# Patient Record
Sex: Female | Born: 1970
Health system: Southern US, Community
[De-identification: ages and names within clinical notes are randomized; demographics above are authoritative.]

## PROBLEM LIST (undated history)

## (undated) DIAGNOSIS — T8859XA Other complications of anesthesia, initial encounter: Secondary | ICD-10-CM

## (undated) DIAGNOSIS — F329 Major depressive disorder, single episode, unspecified: Secondary | ICD-10-CM

## (undated) DIAGNOSIS — F32A Depression, unspecified: Secondary | ICD-10-CM

## (undated) DIAGNOSIS — T4145XA Adverse effect of unspecified anesthetic, initial encounter: Secondary | ICD-10-CM

## (undated) DIAGNOSIS — Z9889 Other specified postprocedural states: Secondary | ICD-10-CM

## (undated) DIAGNOSIS — J45909 Unspecified asthma, uncomplicated: Secondary | ICD-10-CM

## (undated) DIAGNOSIS — I10 Essential (primary) hypertension: Secondary | ICD-10-CM

## (undated) DIAGNOSIS — H548 Legal blindness, as defined in USA: Secondary | ICD-10-CM

## (undated) DIAGNOSIS — R51 Headache: Secondary | ICD-10-CM

## (undated) DIAGNOSIS — R519 Headache, unspecified: Secondary | ICD-10-CM

## (undated) DIAGNOSIS — F514 Sleep terrors [night terrors]: Secondary | ICD-10-CM

## (undated) DIAGNOSIS — R112 Nausea with vomiting, unspecified: Secondary | ICD-10-CM

## (undated) HISTORY — DX: Depression, unspecified: F32.A

## (undated) HISTORY — PX: ABDOMINAL HYSTERECTOMY: SHX81

## (undated) HISTORY — DX: Major depressive disorder, single episode, unspecified: F32.9

## (undated) HISTORY — PX: ENDOMETRIAL ABLATION: SHX621

---

## 1997-08-29 ENCOUNTER — Encounter: Admission: RE | Admit: 1997-08-29 | Discharge: 1997-08-29 | Payer: Self-pay | Admitting: Sports Medicine

## 1997-09-20 ENCOUNTER — Encounter: Admission: RE | Admit: 1997-09-20 | Discharge: 1997-09-20 | Payer: Self-pay | Admitting: Family Medicine

## 1997-10-18 ENCOUNTER — Ambulatory Visit (HOSPITAL_COMMUNITY): Admission: RE | Admit: 1997-10-18 | Discharge: 1997-10-18 | Payer: Self-pay

## 1997-10-24 ENCOUNTER — Encounter: Admission: RE | Admit: 1997-10-24 | Discharge: 1997-10-24 | Payer: Self-pay | Admitting: Sports Medicine

## 1997-11-29 ENCOUNTER — Encounter: Admission: RE | Admit: 1997-11-29 | Discharge: 1997-11-29 | Payer: Self-pay | Admitting: Family Medicine

## 1997-12-29 ENCOUNTER — Encounter: Admission: RE | Admit: 1997-12-29 | Discharge: 1997-12-29 | Payer: Self-pay | Admitting: Family Medicine

## 1998-01-10 ENCOUNTER — Ambulatory Visit (HOSPITAL_COMMUNITY): Admission: RE | Admit: 1998-01-10 | Discharge: 1998-01-10 | Payer: Self-pay

## 1998-01-22 ENCOUNTER — Encounter: Admission: RE | Admit: 1998-01-22 | Discharge: 1998-01-22 | Payer: Self-pay | Admitting: Family Medicine

## 1998-01-31 ENCOUNTER — Encounter: Admission: RE | Admit: 1998-01-31 | Discharge: 1998-01-31 | Payer: Self-pay | Admitting: Family Medicine

## 1998-02-03 ENCOUNTER — Inpatient Hospital Stay (HOSPITAL_COMMUNITY): Admission: AD | Admit: 1998-02-03 | Discharge: 1998-02-03 | Payer: Self-pay | Admitting: *Deleted

## 1998-02-04 ENCOUNTER — Inpatient Hospital Stay (HOSPITAL_COMMUNITY): Admission: AD | Admit: 1998-02-04 | Discharge: 1998-02-04 | Payer: Self-pay | Admitting: Obstetrics & Gynecology

## 1998-02-06 ENCOUNTER — Inpatient Hospital Stay (HOSPITAL_COMMUNITY): Admission: AD | Admit: 1998-02-06 | Discharge: 1998-02-06 | Payer: Self-pay | Admitting: *Deleted

## 1998-02-07 ENCOUNTER — Encounter: Admission: RE | Admit: 1998-02-07 | Discharge: 1998-02-07 | Payer: Self-pay | Admitting: Family Medicine

## 1998-02-21 ENCOUNTER — Encounter: Admission: RE | Admit: 1998-02-21 | Discharge: 1998-02-21 | Payer: Self-pay | Admitting: Family Medicine

## 1998-02-27 ENCOUNTER — Ambulatory Visit (HOSPITAL_COMMUNITY): Admission: RE | Admit: 1998-02-27 | Discharge: 1998-02-27 | Payer: Self-pay | Admitting: *Deleted

## 1998-03-13 ENCOUNTER — Encounter: Admission: RE | Admit: 1998-03-13 | Discharge: 1998-03-13 | Payer: Self-pay | Admitting: Sports Medicine

## 1998-03-27 ENCOUNTER — Encounter: Admission: RE | Admit: 1998-03-27 | Discharge: 1998-03-27 | Payer: Self-pay | Admitting: Family Medicine

## 1998-04-05 ENCOUNTER — Encounter: Admission: RE | Admit: 1998-04-05 | Discharge: 1998-04-05 | Payer: Self-pay | Admitting: Family Medicine

## 1998-04-10 ENCOUNTER — Ambulatory Visit (HOSPITAL_COMMUNITY): Admission: RE | Admit: 1998-04-10 | Discharge: 1998-04-10 | Payer: Self-pay | Admitting: *Deleted

## 1998-04-12 ENCOUNTER — Encounter: Admission: RE | Admit: 1998-04-12 | Discharge: 1998-04-12 | Payer: Self-pay | Admitting: Family Medicine

## 1998-04-14 ENCOUNTER — Inpatient Hospital Stay (HOSPITAL_COMMUNITY): Admission: AD | Admit: 1998-04-14 | Discharge: 1998-04-16 | Payer: Self-pay | Admitting: *Deleted

## 1998-04-17 ENCOUNTER — Encounter: Admission: RE | Admit: 1998-04-17 | Discharge: 1998-04-17 | Payer: Self-pay | Admitting: Sports Medicine

## 1998-05-23 ENCOUNTER — Encounter: Admission: RE | Admit: 1998-05-23 | Discharge: 1998-05-23 | Payer: Self-pay | Admitting: Family Medicine

## 1998-06-05 ENCOUNTER — Other Ambulatory Visit: Admission: RE | Admit: 1998-06-05 | Discharge: 1998-06-05 | Payer: Self-pay

## 1998-06-05 ENCOUNTER — Encounter: Admission: RE | Admit: 1998-06-05 | Discharge: 1998-06-05 | Payer: Self-pay | Admitting: Sports Medicine

## 1999-03-13 ENCOUNTER — Encounter: Admission: RE | Admit: 1999-03-13 | Discharge: 1999-03-13 | Payer: Self-pay | Admitting: Family Medicine

## 2001-04-15 ENCOUNTER — Encounter: Admission: RE | Admit: 2001-04-15 | Discharge: 2001-04-15 | Payer: Self-pay | Admitting: Sports Medicine

## 2001-04-27 ENCOUNTER — Encounter: Admission: RE | Admit: 2001-04-27 | Discharge: 2001-04-27 | Payer: Self-pay | Admitting: Family Medicine

## 2001-04-27 ENCOUNTER — Encounter: Admission: RE | Admit: 2001-04-27 | Discharge: 2001-04-27 | Payer: Self-pay | Admitting: *Deleted

## 2001-04-27 ENCOUNTER — Encounter: Payer: Self-pay | Admitting: *Deleted

## 2001-06-30 ENCOUNTER — Encounter: Admission: RE | Admit: 2001-06-30 | Discharge: 2001-06-30 | Payer: Self-pay | Admitting: Family Medicine

## 2001-10-12 ENCOUNTER — Encounter: Admission: RE | Admit: 2001-10-12 | Discharge: 2001-10-12 | Payer: Self-pay | Admitting: Family Medicine

## 2004-12-23 ENCOUNTER — Ambulatory Visit (HOSPITAL_COMMUNITY): Admission: RE | Admit: 2004-12-23 | Discharge: 2004-12-23 | Payer: Self-pay | Admitting: Family Medicine

## 2007-03-24 ENCOUNTER — Encounter: Payer: Self-pay | Admitting: Obstetrics & Gynecology

## 2007-03-24 ENCOUNTER — Ambulatory Visit (HOSPITAL_COMMUNITY): Admission: RE | Admit: 2007-03-24 | Discharge: 2007-03-24 | Payer: Self-pay | Admitting: Obstetrics & Gynecology

## 2008-10-04 ENCOUNTER — Ambulatory Visit (HOSPITAL_COMMUNITY): Admission: RE | Admit: 2008-10-04 | Discharge: 2008-10-04 | Payer: Self-pay | Admitting: Family Medicine

## 2008-11-22 ENCOUNTER — Ambulatory Visit (HOSPITAL_COMMUNITY): Admission: RE | Admit: 2008-11-22 | Discharge: 2008-11-22 | Payer: Self-pay | Admitting: Family Medicine

## 2010-03-04 ENCOUNTER — Ambulatory Visit (HOSPITAL_COMMUNITY): Admission: RE | Admit: 2010-03-04 | Discharge: 2010-03-04 | Payer: Self-pay | Admitting: Internal Medicine

## 2010-03-24 ENCOUNTER — Emergency Department (HOSPITAL_COMMUNITY): Admission: EM | Admit: 2010-03-24 | Discharge: 2010-03-24 | Payer: Self-pay | Admitting: Emergency Medicine

## 2010-06-16 ENCOUNTER — Encounter: Payer: Self-pay | Admitting: Family Medicine

## 2010-08-07 LAB — URINALYSIS, ROUTINE W REFLEX MICROSCOPIC
Bilirubin Urine: NEGATIVE
Glucose, UA: NEGATIVE mg/dL
Specific Gravity, Urine: 1.025 (ref 1.005–1.030)
pH: 5.5 (ref 5.0–8.0)

## 2010-08-07 LAB — URINE MICROSCOPIC-ADD ON

## 2010-08-07 LAB — COMPREHENSIVE METABOLIC PANEL
AST: 32 U/L (ref 0–37)
Albumin: 3.4 g/dL — ABNORMAL LOW (ref 3.5–5.2)
Chloride: 103 mEq/L (ref 96–112)
Creatinine, Ser: 0.65 mg/dL (ref 0.4–1.2)
Sodium: 137 mEq/L (ref 135–145)
Total Bilirubin: 0.8 mg/dL (ref 0.3–1.2)
Total Protein: 6.4 g/dL (ref 6.0–8.3)

## 2010-08-07 LAB — DIFFERENTIAL
Basophils Absolute: 0 10*3/uL (ref 0.0–0.1)
Eosinophils Relative: 1 % (ref 0–5)
Lymphocytes Relative: 24 % (ref 12–46)
Lymphs Abs: 1 10*3/uL (ref 0.7–4.0)
Monocytes Absolute: 0.2 10*3/uL (ref 0.1–1.0)
Monocytes Relative: 5 % (ref 3–12)
Neutro Abs: 2.8 10*3/uL (ref 1.7–7.7)

## 2010-08-07 LAB — CBC
HCT: 40.7 % (ref 36.0–46.0)
Hemoglobin: 14.2 g/dL (ref 12.0–15.0)
MCHC: 34.8 g/dL (ref 30.0–36.0)
MCV: 87.1 fL (ref 78.0–100.0)

## 2010-10-08 NOTE — H&P (Signed)
NAME:  Marie Webb, Marie Webb NO.:  [id number]   MEDICAL RECORD NO.:  [id number]          PATIENT TYPE:  AMB   LOCATION:  DAY                           FACILITY:  [facility]   PHYSICIAN:  Lazaro Arms, M.D.   DATE OF BIRTH:  06-24-1970   DATE OF ADMISSION:  DATE OF DISCHARGE:  LH                              HISTORY & PHYSICAL   HISTORY:  Marie Webb is a 40 year old female, gravida 5, para 4, abortus 1,  status post tubal ligation, who has increasingly worsening periods.  She  bleeds for 5-7 days, has clots for about 2-3 of those days, changes pads  every 45 minutes.  Her uterus is slightly enlarged at eight weeks' size.  Ultrasound reveals no submucosal myomas and she desires endometrial  ablation for therapy as opposed to hysterectomy.  She understands that  in the future she may need something more definitive, depending on what  the fibroids do.   PAST MEDICAL HISTORY:  Negative.   PAST SURGICAL HISTORY:  Tubal ligation.   PAST OB HISTORY:  All vaginal deliveries and one pregnancy lost.   She is on iron tablets.  Her hemoglobin is 10.9.   PHYSICAL EXAMINATION:  HEENT:  Unremarkable.  NECK:  Thyroid is normal.  LUNGS:  Clear.  HEART:  Regular rate and rhythm without regurge or gallop.  BREASTS:  Without mass, discharge, skin changes.  ABDOMEN:  Benign.  No hepatosplenomegaly or masses.  GENITALIA:  She has normal external genitalia.  Uterus is eight weeks'  size.  Adnexa is negative.   IMPRESSION:  1. Menometrorrhagia.  2. Dysmenorrhea.  3. Anemia.   PLAN:  Patient admitted for hysteroscopy/D&C and endometrial ablation.  She understands the risks, benefits, indications, alternatives and will  proceed.     Lazaro Arms, M.D.  Electronically Signed    LHE/MEDQ  D:  03/23/2007  T:  03/23/2007  Job:  [id number]

## 2010-10-08 NOTE — Op Note (Signed)
Marie Webb, Marie Webb             ACCOUNT NO.:  [id number]   MEDICAL RECORD NO.:  [id number]          PATIENT TYPE:  AMB   LOCATION:  DAY                           FACILITY:  [facility]   PHYSICIAN:  Lazaro Arms, M.D.   DATE OF BIRTH:  12-06-1970   DATE OF PROCEDURE:  03/24/2007  DATE OF DISCHARGE:                               OPERATIVE REPORT   PREOPERATIVE DIAGNOSES:  1. Menometrorrhagia.  2. Dysmenorrhea.   POSTOPERATIVE DIAGNOSES:  1. Menometrorrhagia.  2. Dysmenorrhea.   PROCEDURE:  Hysteroscopy D&C, endometrial ablation.   SURGEON:  Lazaro Arms, M.D.   ANESTHESIA:  General endotracheal.   FINDINGS:  The patient had a normal endometrial cavity, no polyps, no  fibroids, no lesions.   DESCRIPTION OF PROCEDURE:  The patient was taken to the operating room,  placed in the supine position, underwent general endotracheal  anesthesia, prepped and draped in the usual sterile fashion. After being  placed in dorsal lithotomy position, the cervix was grasped, dilated  serially, diagnostic hysteroscopy was performed and it was normal. A  vigorous uterine curettage was performed with good uterine cry being  obtained in all areas. A ThermaChoice 3 endometrial ablation balloon was  then used. 50 mL of D-5-W was required to maintain a pressure of 192  mmHg throughout the procedure. It was heated to 87 degrees Celsius,  total therapy time was 15 minutes and 41 seconds. All fluids returned at  the end of the procedure. The patient tolerated the procedure well. She  experienced minimal blood loss. Taken to the recovery room in good  stable condition. All counts correct x3. She received Ancef  prophylactically.      Lazaro Arms, M.D.  Electronically Signed     LHE/MEDQ  D:  03/24/2007  T:  03/24/2007  Job:  [id number]

## 2011-03-05 LAB — COMPREHENSIVE METABOLIC PANEL
ALT: 13
AST: 10
Albumin: 3.9
CO2: 31
Calcium: 9.1
Chloride: 105
Creatinine, Ser: 0.61
GFR calc Af Amer: >60
GFR calc non Af Amer: >60
Sodium: 141
Total Bilirubin: 0.4

## 2011-03-05 LAB — URINALYSIS, ROUTINE W REFLEX MICROSCOPIC
Bilirubin Urine: NEGATIVE
Glucose, UA: NEGATIVE
Ketones, ur: NEGATIVE
Protein, ur: NEGATIVE
pH: 7

## 2011-03-05 LAB — DIFFERENTIAL
Eosinophils Absolute: 0.5
Eosinophils Relative: 7 — ABNORMAL HIGH
Lymphocytes Relative: 22
Lymphs Abs: 1.4
Monocytes Absolute: 0.4

## 2011-03-05 LAB — CBC
MCV: 84.4
Platelets: 229
RBC: 4.79
WBC: 6.4

## 2011-05-08 ENCOUNTER — Other Ambulatory Visit (HOSPITAL_COMMUNITY): Payer: Self-pay | Admitting: Physician Assistant

## 2011-05-09 ENCOUNTER — Ambulatory Visit (HOSPITAL_COMMUNITY)
Admission: RE | Admit: 2011-05-09 | Discharge: 2011-05-09 | Disposition: A | Discharge status: Home / Self Care | Payer: Self-pay | Source: Ambulatory Visit | Attending: Physician Assistant | Admitting: Physician Assistant

## 2011-05-09 DIAGNOSIS — R109 Unspecified abdominal pain: Secondary | ICD-10-CM | POA: Insufficient documentation

## 2011-05-09 DIAGNOSIS — Z9889 Other specified postprocedural states: Secondary | ICD-10-CM | POA: Insufficient documentation

## 2011-06-06 ENCOUNTER — Other Ambulatory Visit (HOSPITAL_COMMUNITY): Payer: Self-pay | Admitting: Physician Assistant

## 2011-06-06 DIAGNOSIS — Z1231 Encounter for screening mammogram for malignant neoplasm of breast: Secondary | ICD-10-CM

## 2011-06-12 ENCOUNTER — Other Ambulatory Visit: Payer: Self-pay | Admitting: Obstetrics and Gynecology

## 2011-06-12 ENCOUNTER — Ambulatory Visit (HOSPITAL_COMMUNITY)
Admission: RE | Admit: 2011-06-12 | Discharge: 2011-06-12 | Disposition: A | Discharge status: Home / Self Care | Payer: Self-pay | Source: Ambulatory Visit | Attending: Physician Assistant | Admitting: Physician Assistant

## 2011-06-12 DIAGNOSIS — Z1231 Encounter for screening mammogram for malignant neoplasm of breast: Secondary | ICD-10-CM

## 2013-06-17 ENCOUNTER — Encounter (HOSPITAL_COMMUNITY): Payer: Self-pay | Admitting: Emergency Medicine

## 2013-06-17 ENCOUNTER — Emergency Department (HOSPITAL_COMMUNITY): Payer: Self-pay

## 2013-06-17 ENCOUNTER — Observation Stay (HOSPITAL_COMMUNITY)
Admission: EM | Admit: 2013-06-17 | Discharge: 2013-06-18 | Disposition: A | Discharge status: Home / Self Care | Payer: Self-pay | Attending: Emergency Medicine | Admitting: Emergency Medicine

## 2013-06-17 DIAGNOSIS — R05 Cough: Secondary | ICD-10-CM | POA: Diagnosis present

## 2013-06-17 DIAGNOSIS — R0602 Shortness of breath: Principal | ICD-10-CM | POA: Insufficient documentation

## 2013-06-17 DIAGNOSIS — J45909 Unspecified asthma, uncomplicated: Secondary | ICD-10-CM | POA: Insufficient documentation

## 2013-06-17 DIAGNOSIS — J45901 Unspecified asthma with (acute) exacerbation: Secondary | ICD-10-CM | POA: Diagnosis present

## 2013-06-17 DIAGNOSIS — R0989 Other specified symptoms and signs involving the circulatory and respiratory systems: Secondary | ICD-10-CM | POA: Insufficient documentation

## 2013-06-17 DIAGNOSIS — R000 Tachycardia, unspecified: Secondary | ICD-10-CM | POA: Diagnosis present

## 2013-06-17 DIAGNOSIS — R059 Cough, unspecified: Secondary | ICD-10-CM | POA: Insufficient documentation

## 2013-06-17 HISTORY — DX: Unspecified asthma, uncomplicated: J45.909

## 2013-06-17 LAB — CBC WITH DIFFERENTIAL/PLATELET
Basophils Absolute: 0 10*3/uL (ref 0.0–0.1)
Basophils Relative: 0 % (ref 0–1)
Eosinophils Absolute: 0.6 10*3/uL (ref 0.0–0.7)
Eosinophils Relative: 4 % (ref 0–5)
HCT: 43.8 % (ref 36.0–46.0)
Hemoglobin: 15.4 g/dL — ABNORMAL HIGH (ref 12.0–15.0)
LYMPHS ABS: 1.9 10*3/uL (ref 0.7–4.0)
LYMPHS PCT: 12 % (ref 12–46)
MCH: 31.2 pg (ref 26.0–34.0)
MCHC: 35.2 g/dL (ref 30.0–36.0)
MCV: 88.7 fL (ref 78.0–100.0)
Monocytes Absolute: 0.7 10*3/uL (ref 0.1–1.0)
Monocytes Relative: 5 % (ref 3–12)
NEUTROS ABS: 12.2 10*3/uL — AB (ref 1.7–7.7)
NEUTROS PCT: 79 % — AB (ref 43–77)
PLATELETS: 237 10*3/uL (ref 150–400)
RBC: 4.94 MIL/uL (ref 3.87–5.11)
RDW: 12.7 % (ref 11.5–15.5)
WBC: 15.4 10*3/uL — AB (ref 4.0–10.5)

## 2013-06-17 LAB — COMPREHENSIVE METABOLIC PANEL
ALK PHOS: 72 U/L (ref 39–117)
ALT: 13 U/L (ref 0–35)
AST: 13 U/L (ref 0–37)
Albumin: 3.8 g/dL (ref 3.5–5.2)
BUN: 16 mg/dL (ref 6–23)
CO2: 24 meq/L (ref 19–32)
Calcium: 9.2 mg/dL (ref 8.4–10.5)
Chloride: 101 mEq/L (ref 96–112)
Creatinine, Ser: 0.55 mg/dL (ref 0.50–1.10)
GLUCOSE: 170 mg/dL — AB (ref 70–99)
POTASSIUM: 3.3 meq/L — AB (ref 3.7–5.3)
SODIUM: 139 meq/L (ref 137–147)
Total Bilirubin: 0.3 mg/dL (ref 0.3–1.2)
Total Protein: 7.4 g/dL (ref 6.0–8.3)

## 2013-06-17 MED ORDER — IPRATROPIUM BROMIDE 0.02 % IN SOLN
0.5000 mg | Freq: Once | RESPIRATORY_TRACT | Status: AC
  Administered 2013-06-17: 0.5 mg via RESPIRATORY_TRACT
  Filled 2013-06-17: qty 2.5

## 2013-06-17 MED ORDER — METHYLPREDNISOLONE SODIUM SUCC 125 MG IJ SOLR
125.0000 mg | Freq: Once | INTRAMUSCULAR | Status: AC
  Administered 2013-06-17: 125 mg via INTRAVENOUS
  Filled 2013-06-17: qty 2

## 2013-06-17 MED ORDER — ALBUTEROL SULFATE (2.5 MG/3ML) 0.083% IN NEBU
2.5000 mg | INHALATION_SOLUTION | Freq: Once | RESPIRATORY_TRACT | Status: AC
  Administered 2013-06-17: 2.5 mg via RESPIRATORY_TRACT
  Filled 2013-06-17: qty 3

## 2013-06-17 MED ORDER — ALBUTEROL SULFATE (2.5 MG/3ML) 0.083% IN NEBU
5.0000 mg | INHALATION_SOLUTION | Freq: Once | RESPIRATORY_TRACT | Status: AC
  Administered 2013-06-18: 5 mg via RESPIRATORY_TRACT
  Filled 2013-06-17: qty 6

## 2013-06-17 MED ORDER — IPRATROPIUM BROMIDE 0.02 % IN SOLN
0.5000 mg | Freq: Once | RESPIRATORY_TRACT | Status: AC
  Administered 2013-06-18: 0.5 mg via RESPIRATORY_TRACT
  Filled 2013-06-17: qty 2.5

## 2013-06-17 MED ORDER — ALBUTEROL SULFATE (2.5 MG/3ML) 0.083% IN NEBU
5.0000 mg | INHALATION_SOLUTION | Freq: Once | RESPIRATORY_TRACT | Status: AC
  Administered 2013-06-17: 5 mg via RESPIRATORY_TRACT
  Filled 2013-06-17: qty 6

## 2013-06-17 NOTE — ED Provider Notes (Addendum)
CSN: [id number]     Arrival date & time 06/17/13  2029 History  This chart was scribed for Benny LennertJoseph L Lashawnta Burgert, MD by Quintella ReichertMatthew Underwood, ED scribe.  This patient was seen in room [facility] and the patient's care was started at 9:25 PM.   Chief Complaint  Patient presents with  . Shortness of Breath    Patient is a 43 y.o. female presenting with shortness of breath. The history is provided by the patient. No language interpreter was used.  Shortness of Breath Severity:  Moderate Duration:  1 day Timing:  Constant Progression:  Worsening Chronicity:  New Context: URI   Ineffective treatments:  Inhaler Associated symptoms: cough and wheezing   Associated symptoms: no abdominal pain, no chest pain, no headaches and no rash     HPI Comments: Amanee Christel Mormon Lacivita is a 43 y.o. female who presents to the Emergency Department complaining of moderate wheezing that began today.  Pt states she has been sick for the past several days with productive cough, congestion and chills.  Today she became short of breath.  She attempted to treat SOB with her inhaler, without relief.  She states "my chest felt like it was going to close up."  She was given breathing treatment in the ED which she states has provided some relief.  Pt denies prior h/o similar symptoms and states her asthma is normally well-controlled.  She denies h/o admission for respiratory issues  She does not smoke.  [facility]   Past Medical History  Diagnosis Date  . Asthma     Past Surgical History  Procedure Laterality Date  . Endometrial ablation      History reviewed. No pertinent family history.   History  Substance Use Topics  . Smoking status: Never Smoker   . Smokeless tobacco: Not on file  . Alcohol Use: No    OB History   Grav Para Term Preterm Abortions TAB SAB Ect Mult Living                  Review of Systems  Constitutional: Positive for chills. Negative for appetite change  and fatigue.  HENT: Positive for congestion. Negative for ear discharge and sinus pressure.   Eyes: Negative for discharge.  Respiratory: Positive for cough, shortness of breath and wheezing.   Cardiovascular: Negative for chest pain.  Gastrointestinal: Negative for abdominal pain and diarrhea.  Genitourinary: Negative for frequency and hematuria.  Musculoskeletal: Negative for back pain.  Skin: Negative for rash.  Neurological: Negative for seizures and headaches.  Psychiatric/Behavioral: Negative for hallucinations.     Allergies  Review of patient's allergies indicates no known allergies.  Home Medications   Current Outpatient Rx  Name  Route  Sig  Dispense  Refill  . albuterol (PROVENTIL HFA;VENTOLIN HFA) 108 (90 BASE) MCG/ACT inhaler   Inhalation   Inhale 1-2 puffs into the lungs every 6 (six) hours as needed for wheezing or shortness of breath.         . DM-Doxylamine-Acetaminophen 15-6.25-325 MG/15ML LIQD   Oral   Take 15 mLs by mouth at bedtime as needed (Congestion).         Marland Kitchen. ibuprofen (ADVIL,MOTRIN) 200 MG tablet   Oral   Take 800 mg by mouth every 6 (six) hours as needed for moderate pain.          BP 154/106  Pulse 125  Temp(Src) 97.6 F (36.4 C) (Oral)  Resp 28  Ht 5\' 4"  (1.626 m)  Wt 145 lb (65.772 kg)  BMI 24.88 kg/m2  SpO2 95%  LMP 06/17/2013  Physical Exam  Nursing note and vitals reviewed. Constitutional: She is oriented to person, place, and time. She appears well-developed.  HENT:  Head: Normocephalic.  Eyes: Conjunctivae and EOM are normal. No scleral icterus.  Neck: Neck supple. No thyromegaly present.  Cardiovascular: Normal rate and regular rhythm.  Exam reveals no gallop and no friction rub.   No murmur heard. Pulmonary/Chest: No stridor. She has wheezes (moderate wheezing bilaterally). She has no rales. She exhibits no tenderness.  Abdominal: She exhibits no distension. There is no tenderness. There is no rebound.   Musculoskeletal: Normal range of motion. She exhibits no edema.  Lymphadenopathy:    She has no cervical adenopathy.  Neurological: She is oriented to person, place, and time. She exhibits normal muscle tone. Coordination normal.  Skin: No rash noted. No erythema.  Psychiatric: She has a normal mood and affect. Her behavior is normal.    ED Course  Procedures (including critical care time)  DIAGNOSTIC STUDIES: Oxygen Saturation is 95% on room air, adequate by my interpretation.    COORDINATION OF CARE: 9:28 PM-Discussed treatment plan which includes breathing treatment, CXR and labswith pt at bedside and pt agreed to plan.    Labs Review Labs Reviewed - No data to display  Imaging Review No results found.  EKG Interpretation   None       MDM  Admit for asthma The chart was scribed for me under my direct supervision.  I personally performed the history, physical, and medical decision making and all procedures in the evaluation of this patient.Benny Lennert, MD 06/18/13 1610  Benny Lennert, MD 06/18/13 [phone]

## 2013-06-17 NOTE — ED Notes (Signed)
RESPIRATORY PAGED 

## 2013-06-17 NOTE — ED Notes (Signed)
Sob all day, no relief with inhaler.  Cough,

## 2013-06-18 DIAGNOSIS — J45909 Unspecified asthma, uncomplicated: Secondary | ICD-10-CM | POA: Diagnosis present

## 2013-06-18 DIAGNOSIS — J45901 Unspecified asthma with (acute) exacerbation: Secondary | ICD-10-CM | POA: Diagnosis present

## 2013-06-18 DIAGNOSIS — R000 Tachycardia, unspecified: Secondary | ICD-10-CM

## 2013-06-18 DIAGNOSIS — R059 Cough, unspecified: Secondary | ICD-10-CM | POA: Diagnosis present

## 2013-06-18 DIAGNOSIS — R05 Cough: Secondary | ICD-10-CM | POA: Diagnosis present

## 2013-06-18 HISTORY — DX: Tachycardia, unspecified: R00.0

## 2013-06-18 MED ORDER — ALBUTEROL SULFATE HFA 108 (90 BASE) MCG/ACT IN AERS
2.0000 | INHALATION_SPRAY | Freq: Once | RESPIRATORY_TRACT | Status: AC
  Administered 2013-06-18: 2 via RESPIRATORY_TRACT
  Filled 2013-06-18: qty 6.7

## 2013-06-18 MED ORDER — ALBUTEROL SULFATE (2.5 MG/3ML) 0.083% IN NEBU: 2.5000 mg | INHALATION_SOLUTION | Freq: Four times a day (QID) | RESPIRATORY_TRACT | Status: DC | PRN

## 2013-06-18 MED ORDER — PREDNISONE 20 MG PO TABS: 40.0000 mg | ORAL_TABLET | Freq: Every day | ORAL | Status: DC

## 2013-06-18 MED ORDER — AZITHROMYCIN 250 MG PO TABS
500.0000 mg | ORAL_TABLET | Freq: Every day | ORAL | Status: DC
  Administered 2013-06-18: 500 mg via ORAL
  Filled 2013-06-18: qty 2

## 2013-06-18 MED ORDER — AZITHROMYCIN 250 MG PO TABS: 250.0000 mg | ORAL_TABLET | Freq: Every day | ORAL | Status: DC

## 2013-06-18 NOTE — H&P (Signed)
Chief Complaint:  Sob and wheezing  HPI: 43 yo female h/o asthma comes in tonight for progressive worsening sob and wheezing tonight despite her albuterol inhaler that she used several times tonight.  She has been coughing more the last several days.  No fevers.  No n/v/d.  No cp.  She does not have frequent exacerbations of her asthma.  She lives with her husband and 4 children.  No le edema or swelling.  She has received several albuterol nebs here along with iv solumedrol 125 mg and feels much better already.  Still has some wheezing, and is tachycardic.  She does not have health insurance and is concerned about the cost of being hospitalized and wishes to try outpt management along with the fact she has 4 children at home.  Review of Systems:  Positive and negative as per HPI otherwise all other systems are negative  Past Medical History: Past Medical History  Diagnosis Date  . Asthma    Past Surgical History  Procedure Laterality Date  . Endometrial ablation      Medications: Prior to Admission medications   Medication Sig Start Date End Date Taking? Authorizing Provider  albuterol (PROVENTIL HFA;VENTOLIN HFA) 108 (90 BASE) MCG/ACT inhaler Inhale 1-2 puffs into the lungs every 6 (six) hours as needed for wheezing or shortness of breath.   Yes Historical Provider, MD  DM-Doxylamine-Acetaminophen 15-6.25-325 MG/15ML LIQD Take 15 mLs by mouth at bedtime as needed (Congestion).   Yes Historical Provider, MD  ibuprofen (ADVIL,MOTRIN) 200 MG tablet Take 800 mg by mouth every 6 (six) hours as needed for moderate pain.   Yes Historical Provider, MD    Allergies:  No Known Allergies  Social History:  reports that she has never smoked. She does not have any smokeless tobacco history on file. She reports that she does not drink alcohol or use illicit drugs.  Family History: History reviewed. No pertinent family history.  Physical Exam: Filed Vitals:   06/17/13 2034 06/17/13 2106  06/17/13 2146 06/18/13 0003  BP: 154/106     Pulse: 125     Temp: 97.6 F (36.4 C)     TempSrc: Oral     Resp: 28     Height: 5\' 4"  (1.626 m)     Weight: 65.772 kg (145 lb)     SpO2: 91% 95% 97% 96%   General appearance: alert, cooperative and no distress Head: Normocephalic, without obvious abnormality, atraumatic Eyes: negative Nose: Nares normal. Septum midline. Mucosa normal. No drainage or sinus tenderness. Neck: no JVD and supple, symmetrical, trachea midline Lungs: wheezes bibasilar Heart: tachycardic rr no m/r/g Abdomen: soft, non-tender; bowel sounds normal; no masses,  no organomegaly Extremities: extremities normal, atraumatic, no cyanosis or edema Pulses: 2+ and symmetric Skin: Skin color, texture, turgor normal. No rashes or lesions Neurologic: Grossly normal    Labs on Admission:   Recent Labs  06/17/13 2147  NA 139  K 3.3*  CL 101  CO2 24  GLUCOSE 170*  BUN 16  CREATININE 0.55  CALCIUM 9.2    Recent Labs  06/17/13 2147  AST 13  ALT 13  ALKPHOS 72  BILITOT 0.3  PROT 7.4  ALBUMIN 3.8    Recent Labs  06/17/13 2147  WBC 15.4*  NEUTROABS 12.2*  HGB 15.4*  HCT 43.8  MCV 88.7  PLT 237    Radiological Exams on Admission: Dg Chest 2 View  06/17/2013   CLINICAL DATA:  Cough, congestion and chest pressure. History of asthma.  EXAM: CHEST  2 VIEW  COMPARISON:  None.  FINDINGS: The lungs are well-aerated and clear. There is no evidence of focal opacification, pleural effusion or pneumothorax.  The heart is normal in size; the mediastinal contour is within normal limits. No acute osseous abnormalities are seen.  IMPRESSION: No acute cardiopulmonary process seen.   Electronically Signed   By: Roanna RaiderJeffery  Chang M.D.   On: 06/17/2013 22:55    Assessment/Plan  43 yo female with acute asthma exacrbation  Principal Problem:   Acute asthma exacerbation-  Would give pt azithromycin which ive ordered.  She seems to have improved much with tx given in ED.   cxr neg for infiltrate or edema.  Would walk her around and assess whether her oxgyen sats drop significantly or not.  If it does not, i think pt is reliable enough along with her husband to try outpt management of this exacerbation.  If she chooses that route, would give her a steroid taper and a zpack along with her home inhaler of albuterol.  Tachycardia is iatrogenic from bronchodilators here.  She should continue to improve with the steroids, alb at home.  She has been instructed if she were to worsen again, to return to the ED for reassessment.  Her and her husband understand and agree.  Active Problems:   Asthma   Cough   Sinus tachycardia    Ronell Duffus A 06/18/2013, 12:36 AM

## 2013-06-18 NOTE — Discharge Instructions (Signed)
[facility] ° ° ° °Edward Hawkins MD. Specialty: Pulmonary Disease Contact information: [address]  °[address]  °[place] [address]  °[phone]  ° °Margaret Simpson, MD. Specialty: Family Medicine Contact information: [address]  °[place] [address]  °[phone]  ° °Scott Luking, MD. Specialty: Family Medicine Contact information: [address]  °[address]  °[place] [address]  °[phone]  ° °Tesfaye Fanta, MD Specialty: Internal Medicine Contact information: [address]  °[place] [address]  °[phone]  ° °Zach Hall, MD. Specialty: Internal Medicine Contact information: [address]  °[place] [address]  °[phone]  ° °Angus Mcinnis, MD. Specialty: Family Medicine Contact information: [address]  °[place] [address]  °[phone]  ° °Stephen Knowlton, MD. Specialty: Family Medicine Contact information: [address]  °[address]  °[place] [address]  °[phone]  ° °Roy Fagan, MD. Specialty: Internal Medicine Contact information: [address]  °[address]  ° [place] [address]  °[phone]  ° ° °

## 2013-06-18 NOTE — ED Provider Notes (Signed)
The patient has refused admission to the hospital citing financial reasons. Her heart rate has reduced down to approximately 120, she is insistent on being discharged, she will be given a Zithromax prescription at the request of the hospitalist who has seen the patient and agrees that she can be discharged safely at her request. She will also be given albuterol inhaler metered dose inhaler for home as well as nebulizer refills and prednisone.  Meds given in ED:  Medications  azithromycin (ZITHROMAX) tablet 500 mg (not administered)  albuterol (PROVENTIL HFA;VENTOLIN HFA) 108 (90 BASE) MCG/ACT inhaler 2 puff (not administered)  albuterol (PROVENTIL) (2.5 MG/3ML) 0.083% nebulizer solution 2.5 mg (2.5 mg Nebulization Given 06/17/13 2106)  ipratropium (ATROVENT) nebulizer solution 0.5 mg (0.5 mg Nebulization Given 06/17/13 2106)  albuterol (PROVENTIL) (2.5 MG/3ML) 0.083% nebulizer solution 5 mg (5 mg Nebulization Given 06/17/13 2145)  ipratropium (ATROVENT) nebulizer solution 0.5 mg (0.5 mg Nebulization Given 06/17/13 2145)  methylPREDNISolone sodium succinate (SOLU-MEDROL) 125 mg/2 mL injection 125 mg (125 mg Intravenous Given 06/17/13 2209)  ipratropium (ATROVENT) nebulizer solution 0.5 mg (0.5 mg Nebulization Given 06/18/13 0001)  albuterol (PROVENTIL) (2.5 MG/3ML) 0.083% nebulizer solution 5 mg (5 mg Nebulization Given 06/18/13 0001)    New Prescriptions   ALBUTEROL (PROVENTIL) (2.5 MG/3ML) 0.083% NEBULIZER SOLUTION    Take 3 mLs (2.5 mg total) by nebulization every 6 (six) hours as needed for wheezing or shortness of breath.   AZITHROMYCIN (ZITHROMAX Z-PAK) 250 MG TABLET    Take 1 tablet (250 mg total) by mouth daily. 500mg  PO day 1, then 250mg  PO days 205   PREDNISONE (DELTASONE) 20 MG TABLET    Take 2 tablets (40 mg total) by mouth daily.      Vida RollerBrian D Riyaan Heroux, MD 06/18/13 [phone]

## 2013-06-18 NOTE — ED Notes (Signed)
PT INFORMED ME THAT SHE FEELS BETTER AND DOES NOT WANT TO BE ADMITTED UNLESS ABSOLUTELY NECESSARY. PT CONCERNED ABOUT BEING ABLE TO PAY FOR TREATMENT. WILL NOTIFY EDP.

## 2013-06-20 NOTE — ED Notes (Signed)
Pt called and reports that she told EDP that she thought she had a nebulizer machine at home that she could use.  EDP wrote for nebulizer solution.   However, when pt got home, she saw that the machine was broken and was told that she had to have  Prescription to receive a new one.  Pt has history of asthma  And was given an albuterol inhaler.  Pt reports [facility] won't see her for 2 more weeks.  Notified H. Beverely PaceBryant PA and gave authorization to call in nebulizer for pt.  [facility].

## 2014-01-05 ENCOUNTER — Other Ambulatory Visit (HOSPITAL_COMMUNITY): Payer: Self-pay | Admitting: *Deleted

## 2014-01-05 DIAGNOSIS — Z1231 Encounter for screening mammogram for malignant neoplasm of breast: Secondary | ICD-10-CM

## 2014-01-12 ENCOUNTER — Ambulatory Visit (HOSPITAL_COMMUNITY)
Admission: RE | Admit: 2014-01-12 | Discharge: 2014-01-12 | Disposition: A | Discharge status: Home / Self Care | Payer: PRIVATE HEALTH INSURANCE | Source: Ambulatory Visit | Attending: *Deleted | Admitting: *Deleted

## 2014-01-12 DIAGNOSIS — Z1231 Encounter for screening mammogram for malignant neoplasm of breast: Secondary | ICD-10-CM | POA: Diagnosis not present

## 2014-01-16 ENCOUNTER — Other Ambulatory Visit (HOSPITAL_COMMUNITY): Payer: Self-pay | Admitting: *Deleted

## 2014-01-16 DIAGNOSIS — N63 Unspecified lump in unspecified breast: Secondary | ICD-10-CM

## 2014-01-31 ENCOUNTER — Ambulatory Visit (HOSPITAL_COMMUNITY)
Admission: RE | Admit: 2014-01-31 | Discharge: 2014-01-31 | Disposition: A | Discharge status: Home / Self Care | Payer: PRIVATE HEALTH INSURANCE | Source: Ambulatory Visit | Attending: *Deleted | Admitting: *Deleted

## 2014-01-31 ENCOUNTER — Other Ambulatory Visit (HOSPITAL_COMMUNITY): Payer: Self-pay | Admitting: *Deleted

## 2014-01-31 DIAGNOSIS — N63 Unspecified lump in unspecified breast: Secondary | ICD-10-CM

## 2014-03-02 ENCOUNTER — Encounter: Payer: Self-pay | Admitting: Obstetrics & Gynecology

## 2014-03-02 ENCOUNTER — Ambulatory Visit (INDEPENDENT_AMBULATORY_CARE_PROVIDER_SITE_OTHER): Payer: Self-pay | Admitting: Obstetrics & Gynecology

## 2014-03-02 VITALS — BP 122/90 | Ht 64.2 in | Wt 155.0 lb

## 2014-03-02 DIAGNOSIS — N938 Other specified abnormal uterine and vaginal bleeding: Secondary | ICD-10-CM | POA: Insufficient documentation

## 2014-03-02 MED ORDER — MEDROXYPROGESTERONE ACETATE 10 MG PO TABS: 10.0000 mg | ORAL_TABLET | Freq: Every day | ORAL | Status: DC

## 2014-03-02 NOTE — Progress Notes (Signed)
Patient ID: Marie MesaKatrina P Webb, female   DOB: 08-Dec-1970, 43 y.o.   MRN: [id number] Chief Complaint  Patient presents with  . Referral    abdominal pain/ occasional bleeding. had ablation 10/2006    HPI The [facility] irregular unpredictable vaginal bleeding. It is noted the patient had an endometrial ablation the 2 heavy painful menstrual periods and 2008 that operative note is reviewed Additionally I reviewed a pelvic sonogram she had performed in December 2012 which showed a small hematometria not uncommon post ablation as well as small fibroids again not clinically significant The patient states that she has some spotting mostly dark and occasionally maybe one or 2 days a month heavier bleeding This is generally accompanied by right her left lower quadrant pain again lasting a day or so  ROS No burning with urination, frequency or urgency No nausea, vomiting or diarrhea Nor fever chills or other constitutional symptoms   Blood pressure 122/90, height 5' 4.2" (1.631 m), weight 155 lb (70.308 kg).  EXAM Abdomen:      soft Vulva:            normal appearing vulva with no masses, tenderness or lesions Vagina:          normal mucosa, no discharge  Grade 2 uterine prolapse Cervix:           normal appearance Uterus:          uterus is normal size, shape, consistency and nontender Adnexa:         normal adnexa in size, nontender and no masses Rectal:            Hemoccult:                               Assessment/Plan:  DUB  Post ablation, will suppress with daily provera 10 mg and follow up in 3 months

## 2014-05-23 ENCOUNTER — Other Ambulatory Visit: Payer: Self-pay | Admitting: *Deleted

## 2014-05-23 MED ORDER — MEDROXYPROGESTERONE ACETATE 10 MG PO TABS: 10.0000 mg | ORAL_TABLET | Freq: Every day | ORAL | Status: DC

## 2014-06-16 ENCOUNTER — Ambulatory Visit: Payer: Self-pay | Admitting: Family Medicine

## 2014-06-19 ENCOUNTER — Ambulatory Visit: Payer: Self-pay | Admitting: Obstetrics & Gynecology

## 2014-06-26 ENCOUNTER — Ambulatory Visit (INDEPENDENT_AMBULATORY_CARE_PROVIDER_SITE_OTHER): Payer: Self-pay | Admitting: Obstetrics & Gynecology

## 2014-06-26 ENCOUNTER — Encounter: Payer: Self-pay | Admitting: Obstetrics & Gynecology

## 2014-06-26 VITALS — BP 120/90 | Wt 155.0 lb

## 2014-06-26 DIAGNOSIS — N938 Other specified abnormal uterine and vaginal bleeding: Secondary | ICD-10-CM

## 2014-06-26 MED ORDER — MEDROXYPROGESTERONE ACETATE 10 MG PO TABS: 10.0000 mg | ORAL_TABLET | Freq: Every day | ORAL | Status: DC

## 2014-06-26 NOTE — Progress Notes (Signed)
Patient ID: Donnie MesaKatrina P Minks, female   DOB: November 30, 1970, 44 y.o.   MRN: [id number] Pt has been taking the provera 10 mg daily and has had very little bleeding She states it is "significantly" improved  Wants to continue on it Has had an ablation in the past  Blood pressure 120/90, weight 155 lb (70.308 kg).  Continue provera 10 mg daily

## 2014-06-27 ENCOUNTER — Ambulatory Visit (INDEPENDENT_AMBULATORY_CARE_PROVIDER_SITE_OTHER): Payer: Self-pay | Admitting: Family Medicine

## 2014-06-27 ENCOUNTER — Encounter: Payer: Self-pay | Admitting: Family Medicine

## 2014-06-27 VITALS — BP 134/86 | HR 96 | Temp 98.4°F | Ht 64.5 in | Wt 148.2 lb

## 2014-06-27 DIAGNOSIS — F4323 Adjustment disorder with mixed anxiety and depressed mood: Secondary | ICD-10-CM | POA: Insufficient documentation

## 2014-06-27 DIAGNOSIS — Z23 Encounter for immunization: Secondary | ICD-10-CM

## 2014-06-27 DIAGNOSIS — J45901 Unspecified asthma with (acute) exacerbation: Secondary | ICD-10-CM

## 2014-06-27 MED ORDER — PREDNISONE 20 MG PO TABS: 40.0000 mg | ORAL_TABLET | Freq: Every day | ORAL | Status: DC

## 2014-06-27 NOTE — Patient Instructions (Signed)
It was nice to meet you today.  I think you are having an asthma exacerbation.  Take prednisone 40mg  daily for the next 5 days.  Call and let me know how your asthma is doing after finishing the prednisone.  We can think about adding another medicine at that time.  Schedule an appointment with Dr. Raymondo BandKoval for PFTs when leaving.  Make a follow-up appointment with me in about 1 month.  Take Care,  Dr. B  Asthma Asthma is a recurring condition in which the airways tighten and narrow. Asthma can make it difficult to breathe. It can cause coughing, wheezing, and shortness of breath. Asthma episodes, also called asthma attacks, range from minor to life-threatening. Asthma cannot be cured, but medicines and lifestyle changes can help control it. CAUSES Asthma is believed to be caused by inherited (genetic) and environmental factors, but its exact cause is unknown. Asthma may be triggered by allergens, lung infections, or irritants in the air. Asthma triggers are different for each person. Common triggers include:   Animal dander.  Dust mites.  Cockroaches.  Pollen from trees or grass.  Mold.  Smoke.  Air pollutants such as dust, household cleaners, hair sprays, aerosol sprays, paint fumes, strong chemicals, or strong odors.  Cold air, weather changes, and winds (which increase molds and pollens in the air).  Strong emotional expressions such as crying or laughing hard.  Stress.  Certain medicines (such as aspirin) or types of drugs (such as beta-blockers).  Sulfites in foods and drinks. Foods and drinks that may contain sulfites include dried fruit, potato chips, and sparkling grape juice.  Infections or inflammatory conditions such as the flu, a cold, or an inflammation of the nasal membranes (rhinitis).  Gastroesophageal reflux disease (GERD).  Exercise or strenuous activity. SYMPTOMS Symptoms may occur immediately after asthma is triggered or many hours later. Symptoms  include:  Wheezing.  Excessive nighttime or early morning coughing.  Frequent or severe coughing with a common cold.  Chest tightness.  Shortness of breath. DIAGNOSIS  The diagnosis of asthma is made by a review of your medical history and a physical exam. Tests may also be performed. These may include:  Lung function studies. These tests show how much air you breathe in and out.  Allergy tests.  Imaging tests such as X-rays. TREATMENT  Asthma cannot be cured, but it can usually be controlled. Treatment involves identifying and avoiding your asthma triggers. It also involves medicines. There are 2 classes of medicine used for asthma treatment:   Controller medicines. These prevent asthma symptoms from occurring. They are usually taken every day.  Reliever or rescue medicines. These quickly relieve asthma symptoms. They are used as needed and provide short-term relief. Your health care provider will help you create an asthma action plan. An asthma action plan is a written plan for managing and treating your asthma attacks. It includes a list of your asthma triggers and how they may be avoided. It also includes information on when medicines should be taken and when their dosage should be changed. An action plan may also involve the use of a device called a peak flow meter. A peak flow meter measures how well the lungs are working. It helps you monitor your condition. HOME CARE INSTRUCTIONS   Take medicines only as directed by your health care provider. Speak with your health care provider if you have questions about how or when to take the medicines.  Use a peak flow meter as directed by your  health care provider. Record and keep track of readings.  Understand and use the action plan to help minimize or stop an asthma attack without needing to seek medical care.  Control your home environment in the following ways to help prevent asthma attacks:  Do not smoke. Avoid being exposed to  secondhand smoke.  Change your heating and air conditioning filter regularly.  Limit your use of fireplaces and wood stoves.  Get rid of pests (such as roaches and mice) and their droppings.  Throw away plants if you see mold on them.  Clean your floors and dust regularly. Use unscented cleaning products.  Try to have someone else vacuum for you regularly. Stay out of rooms while they are being vacuumed and for a short while afterward. If you vacuum, use a dust mask from a hardware store, a double-layered or microfilter vacuum cleaner bag, or a vacuum cleaner with a HEPA filter.  Replace carpet with wood, tile, or vinyl flooring. Carpet can trap dander and dust.  Use allergy-proof pillows, mattress covers, and box spring covers.  Wash bed sheets and blankets every week in hot water and dry them in a dryer.  Use blankets that are made of polyester or cotton.  Clean bathrooms and kitchens with bleach. If possible, have someone repaint the walls in these rooms with mold-resistant paint. Keep out of the rooms that are being cleaned and painted.  Wash hands frequently. SEEK MEDICAL CARE IF:   You have wheezing, shortness of breath, or a cough even if taking medicine to prevent attacks.  The colored mucus you cough up (sputum) is thicker than usual.  Your sputum changes from clear or white to yellow, green, gray, or bloody.  You have any problems that may be related to the medicines you are taking (such as a rash, itching, swelling, or trouble breathing).  You are using a reliever medicine more than 2-3 times per week.  Your peak flow is still at 50-79% of your personal best after following your action plan for 1 hour.  You have a fever. SEEK IMMEDIATE MEDICAL CARE IF:   You seem to be getting worse and are unresponsive to treatment during an asthma attack.  You are short of breath even at rest.  You get short of breath when doing very little physical activity.  You have  difficulty eating, drinking, or talking due to asthma symptoms.  You develop chest pain.  You develop a fast heartbeat.  You have a bluish color to your lips or fingernails.  You are light-headed, dizzy, or faint.  Your peak flow is less than 50% of your personal best. MAKE SURE YOU:   Understand these instructions.  Will watch your condition.  Will get help right away if you are not doing well or get worse. Document Released: 05/12/2005 Document Revised: 09/26/2013 Document Reviewed: 12/09/2012 [facility] Patient Information 2015 [place], [place]. This information is not intended to replace advice given to you by your health care provider. Make sure you discuss any questions you have with your health care provider.

## 2014-06-27 NOTE — Assessment & Plan Note (Signed)
Likely adjustment disorder in settign of multiple stressful home events - Will continue to monitor, no medicatiosn at this time - No Si/HI, PHQ2 neg - f/u at next visit

## 2014-06-27 NOTE — Progress Notes (Signed)
   Subjective:   Marie Webb is a 44 y.o. female with a history of asthma, DUB here for establishing care.  Asthma: used to struggle with as adolescent, no problems until 1-2 years ago - Got tightness in chest while out in cold - worse in humidity or out in cold - tried advair, got really bad headaches - nighttime awakenings almost every night - has viral URI currently, so more wheezy - gets more short of breath with exercise - no CP  Anxiety: feeling more worried lately in setting of recent accident with son, and son's wife leaving him. - Nervous feeling passes - Not interested in taking medicine - Taking St. John's wort x1wk - PHQ-2 neg - family history of MDD with suicide attempts - Thinking about seeing a therapist, but feels like mom was "drugged" instead of talking - worried about finances too  Review of Systems:  Per HPI. All other systems reviewed and are negative.   PMH, PSH, Medications, Allergies, and FmHx reviewed and updated in EMR.  Social History: never smoker  Objective:  BP 134/86 mmHg  Pulse 96  Temp(Src) 98.4 F (36.9 C) (Oral)  Ht 5' 4.5" (1.638 m)  Wt 148 lb 4 oz (67.246 kg)  BMI 25.06 kg/m2  Gen:  44 y.o. female in NAD HEENT: NCAT, MMM, EOMI, PERRL, anicteric sclerae CV: RRR, no MRG, no JVD Resp: Non-labored, Diffuse wheezes noted Ext: WWP, no edema Neuro: Alert and oriented, speech normal    Assessment:     Marie Webb is a 44 y.o. female here for establishing care, asthma exacerbation, anxiety.    Plan:     See problem list for problem-specific plans.   Shirlee LatchAngela Bacigalupo, MD PGY-1,  [facility] 06/27/2014  2:43 PM

## 2014-06-27 NOTE — Addendum Note (Signed)
Addended by: Elgie CongoADAMS, Jonty Morrical E on: 06/27/2014 04:31 PM   Modules accepted: Orders

## 2014-06-27 NOTE — Assessment & Plan Note (Signed)
Unable to assess severity of asthma while in acute exacerbation Peak flow:490, 380, 340. SpO2 96% Trigger: viral URI - continue albuterol prn - prednisone 40mg  daily x5 days - patient to call with albuterol usage and symptoms after prednisone - consider starting QVar +/- singulair if still seems to have persistent asthma - referral to pharm clinic for PFTs

## 2014-06-29 ENCOUNTER — Telehealth: Payer: Self-pay | Admitting: Family Medicine

## 2014-06-29 NOTE — Telephone Encounter (Signed)
Pt called because she is not getting any better and didn't know if she needed to come back in. She would like to talk to a nurse. jw

## 2014-06-29 NOTE — Telephone Encounter (Signed)
Spoke with patient, se states she is starting to cough up more mucus and wanted to know if it was ok to take mucinex. Offered appointment but patient states she will try the mucinex over the weekend and call back on Monday if she feels no better.

## 2014-07-28 ENCOUNTER — Encounter: Payer: Self-pay | Admitting: Pharmacist

## 2014-07-28 ENCOUNTER — Ambulatory Visit (INDEPENDENT_AMBULATORY_CARE_PROVIDER_SITE_OTHER): Payer: Self-pay | Admitting: Pharmacist

## 2014-07-28 DIAGNOSIS — J45909 Unspecified asthma, uncomplicated: Secondary | ICD-10-CM

## 2014-07-28 MED ORDER — MOMETASONE FURO-FORMOTEROL FUM 200-5 MCG/ACT IN AERO: 1.0000 | INHALATION_SPRAY | Freq: Two times a day (BID) | RESPIRATORY_TRACT | Status: DC

## 2014-07-28 NOTE — Progress Notes (Addendum)
S:    Patient arrives in good spirits, unassisted. Presents for lung function evaluation.  Patient reports breathing has been more difficult over the 1-2 years. She reports that she was told she had asthma as a child and that she believes it has returned recently. She typically experiencing shortness of breath at night, which is when she most often uses her albuterol.  Patient reports last dose of asthma medications was last night. She reports having an albuterol inhaler and nebulizer.   Patient reports that she was given Advair in the past but that it caused a severe headache and she "would rather be gasping for air than have that bad of a headache."  Patient reports that she gets short of breath when exercising, when the weather changes, and with seasonal allergies. Patient denies any history of tobacco use. Patient denies exposure to any chemicals or lung irritants.   She reports that her son and grandson both have been diagnosed with asthma.   Patient reports that she does not have insurance and that [facility] the [facility].    O:  See "scanned report" or Documentation Flowsheet (discrete results - PFTs) for  Spirometry results. Patient provided good effort while attempting spirometry.   Albuterol Neb  Lot# [id number]     Exp. August 2017  A/P: Spirometry evaluation reveals mild obstructive lung disease. This mild obstruction is likely due to chronic lung changes from inadequately treated asthma. Post nebulized albuterol tx revealed significant improvement in FVC and FEV1.. Patient has been experiencing shortness of breahth over the last 1-2 years and taking albuterol as needed, mostly every night. Continue albuterol as needed. Plan to initiateDulera 200/5 1 puff twice daily when samples are available.  Educated patient on purpose, proper use, potential adverse effects including risk of esophageal candidiasis and need to rinse mouth  after each use.  Reviewed results of pulmonary function tests. Patient verbalized understanding of results and education.  Written pt instructions provided.  F/U Clinic visit 2 weeks after [facility] is [facility] (patient will call to schedule visit). Will contact patient when we receive [name] samples.  Total time in face to face counseling 30 minutes.  Patient seen with Valrie HartKristin Gray, PharmD Candidate, Babs BertinHaley Baird,  PharmD Resident, and Juanita CraverStacey Karl, PharmD resident.   Able to provide sample on 08/02/2014 for patient pick up.   Lot # [id number]   Exp. 12/25/2015   [name] 200/5    Instructions 1 puff twice daily.

## 2014-07-28 NOTE — Patient Instructions (Signed)
Start using your [name] 1 puffs twice a day  [facility]  Keep taking your albuterol as needed.  Come back and see us in two weeks.

## 2014-07-28 NOTE — Assessment & Plan Note (Signed)
Spirometry evaluation reveals mild obstructive lung disease. This mild obstruction is likely due to chronic lung changes from inadequately treated asthma. Post nebulized albuterol tx revealed significant improvement in FVC and FEV1.. Patient has been experiencing shortness of breahth over the last 1-2 years and taking albuterol as needed, mostly every night. Continue albuterol as needed. Plan to initiateDulera 200/5 1 puff twice daily when samples are available.  Educated patient on purpose, proper use, potential adverse effects including risk of esophageal candidiasis and need to rinse mouth after each use.  Reviewed results of pulmonary function tests. Patient verbalized understanding of results and education.  Written pt instructions provided.  F/U Clinic visit 2 weeks after she is able to get [facility] (patient will call to schedule visit). Will contact patient when we receive [name] samples.  Total time in face to face counseling 30 minutes.  Patient seen with Valrie HartKristin Gray, PharmD Candidate, Babs BertinHaley Baird,  PharmD Resident, and Juanita CraverStacey Karl, PharmD resident.

## 2014-07-31 ENCOUNTER — Other Ambulatory Visit: Payer: Self-pay | Admitting: Pharmacist

## 2014-07-31 ENCOUNTER — Encounter: Payer: Self-pay | Admitting: Pharmacist

## 2014-07-31 MED ORDER — MOMETASONE FURO-FORMOTEROL FUM 200-5 MCG/ACT IN AERO: 1.0000 | INHALATION_SPRAY | Freq: Two times a day (BID) | RESPIRATORY_TRACT | Status: DC

## 2014-07-31 NOTE — Progress Notes (Signed)
[facility] to [facility]. They do not accept verbal orders. Will fax prescription order for [facility]

## 2014-07-31 NOTE — Progress Notes (Signed)
Patient ID: Marie MesaKatrina P Keyworth, female   DOB: 02-Jun-1970, 44 y.o.   MRN: [id number] Reviewed: Agree with Dr. Macky LowerKoval's documentation and management.

## 2014-08-01 ENCOUNTER — Telehealth: Payer: Self-pay | Admitting: Pharmacist

## 2014-08-01 NOTE — Telephone Encounter (Signed)
Spoke with patient and informed her that her prescription for [facility] had been [facility] and [facility].  Patient aware that she should schedule a pharmacy clinic appointment once she has been on [facility] for 2-3 weeks.

## 2014-08-02 ENCOUNTER — Telehealth: Payer: Self-pay | Admitting: Pharmacist

## 2014-08-02 NOTE — Telephone Encounter (Signed)
Called patient to inform her that we had received [name] samples. Unable to reach patient. Left a message for her to call me back.

## 2014-08-03 ENCOUNTER — Telehealth: Payer: Self-pay | Admitting: Pharmacist

## 2014-08-03 NOTE — Telephone Encounter (Signed)
Patient returned my call.  She will come to pick up her [facility] as soon as she is able.

## 2014-08-11 ENCOUNTER — Ambulatory Visit: Payer: Self-pay | Admitting: Pharmacist

## 2014-10-03 ENCOUNTER — Telehealth: Payer: Self-pay | Admitting: Family Medicine

## 2014-10-03 NOTE — Telephone Encounter (Signed)
Will forward to MD to see if patient can try a cheaper option?

## 2014-10-03 NOTE — Telephone Encounter (Signed)
Pt was seen back in March, was going to start taking [name] but after finding out the health dept could not help her with paying for it she decided to try to tough it out. After a few months pt says her asthma is getting worse so she started the dulera, she wants to know if there is any other program that can help her pay for it or any other medication she can try that is affordable?

## 2014-10-04 NOTE — Telephone Encounter (Signed)
Dr. Raymondo BandKoval,  I saw that you and some of the pharmacy residents were working with this patient to help her get the [facility].  Do you know what became of the health dept helping her get this?  Thanks! Marylene LandAngela

## 2014-10-06 NOTE — Telephone Encounter (Signed)
Will be out of town next weekend Please call her at [phone]  Cell phone

## 2014-10-16 ENCOUNTER — Telehealth: Payer: Self-pay | Admitting: Pharmacist

## 2014-10-16 NOTE — Telephone Encounter (Signed)
Spoke with patient and she reported that she had about 2 weeks worth left of her [name] (30 puffs).   She did not [facility] because [facility]. She reports that they would be willing to get it for her but she has to be a patient seen there and she does not want to go there.  We can provide patient with more samples if she comes in to see us and then come up with a plan to help her get her medication. I am wondering if it would be better if she was seen by [facility] so that she could do a medication assistance program through them.  Scheduled appointment with patient next week. Patient verbalized understanding.  Juanita CraverStacey Karl, PharmD, BCPS [phone]

## 2014-10-24 NOTE — Telephone Encounter (Signed)
Patient is having difficulties getting her medications.  This is an on-going issue.  Appreciate pharmacy assistance with this matter.  Would be willing to fill out patient assistance program documentation to help patient receive [name] at a more affordable price.  Also agree with Juanita CraverStacey Karl, PharmD that this patient may benefit more from becoming a patient at [facility] so she could receive all of her meds at little to no cost.  Will ask pharmacy clinic to discuss these options with the patient when they see her this week in clinic.  Erasmo DownerAngela M Laurenashley Viar, MD, MPH PGY-1,  [facility] 10/24/2014 12:19 PM

## 2014-10-26 ENCOUNTER — Ambulatory Visit: Payer: Self-pay | Admitting: Pharmacist

## 2014-10-31 ENCOUNTER — Ambulatory Visit (INDEPENDENT_AMBULATORY_CARE_PROVIDER_SITE_OTHER): Payer: Self-pay | Admitting: Pharmacist

## 2014-10-31 ENCOUNTER — Encounter: Payer: Self-pay | Admitting: Pharmacist

## 2014-10-31 VITALS — BP 138/94 | HR 104 | Ht 64.5 in | Wt 152.0 lb

## 2014-10-31 DIAGNOSIS — G43909 Migraine, unspecified, not intractable, without status migrainosus: Secondary | ICD-10-CM | POA: Insufficient documentation

## 2014-10-31 DIAGNOSIS — G43009 Migraine without aura, not intractable, without status migrainosus: Secondary | ICD-10-CM

## 2014-10-31 DIAGNOSIS — J45909 Unspecified asthma, uncomplicated: Secondary | ICD-10-CM

## 2014-10-31 MED ORDER — AMITRIPTYLINE HCL 25 MG PO TABS: ORAL_TABLET | ORAL | Status: DC

## 2014-10-31 MED ORDER — MOMETASONE FURO-FORMOTEROL FUM 200-5 MCG/ACT IN AERO: 2.0000 | INHALATION_SPRAY | Freq: Two times a day (BID) | RESPIRATORY_TRACT | Status: DC

## 2014-10-31 NOTE — Patient Instructions (Signed)
It was great to see you today - we are glad that your breathing is getting better!  Increase the [name] to 2 puffs twice daily.   Start taking the amitriptyline to help prevent migraines. Start taking 1 tablet (25 mg) every night. After 1 week, increase to 2 tablets (50 mg total) every night. After 1 week of this, increase to 3 tablets (75 mg) every night.  Call us if you have an side effects that are bothersome.  We will work on getting [facility] in [facility].

## 2014-10-31 NOTE — Assessment & Plan Note (Signed)
Asthma/COPD: currently uncontrolled based on frequent night-time awakening to use albuterol. However, her breathing has improved since the initiation of the [facility]. Will increase [name] to 2 puffs twice a day to hopefully reduce nighttime albuterol use and improve control of symptoms. Re-educated on side effects, including the risk of oral candidiasis. [facility]. Provided patient with 2 [name] samples to cover her until she can be seen there.

## 2014-10-31 NOTE — Progress Notes (Signed)
S:    Patient arrives in good spirits. She is accompanied by her youngest daughter.  She reports that her breathing has improved. However, she reports that she wakes up about 4 times a week to us her albuterol inhaler. She also reports that she was recently sick and had to use her nebulizer to help her breathe. She currently has 18 doses left on her [name] inhaler. She reports that she and her husband have been trying to rid the home of allergens, and they are considering removing the carpet.   She also reports frequent migraines (8-10/month). She reports that she used to use Imitrex for her migraines and that it worked Adult nursewonderfully. However, she is not able to afford that and is no longer followed by neurology due to cost. She recently has been using ibuprofen 200 mg (up to 10 tablets daily) and Excedrin Migraine for her migraines. She reports she is concerned about the gastrointestinal side effects of taking so much ibuprofen as it takes multiple doses of 800 mg to relieve her headache. She denies aura or any other symptoms associated with the headache. However, she does report that her migraines are more frequent right before and during menstruation.    O: BP Readings from Last 3 Encounters:  10/31/14 138/94  07/28/14 138/92  06/27/14 134/86      A/P:  Asthma/COPD: currently uncontrolled based on frequent night-time awakening to use albuterol. However, her breathing has improved since the initiation of the [facility]. Will increase [name] to 2 puffs twice a day to hopefully reduce nighttime albuterol use and improve control of symptoms. Re-educated on side effects, including the risk of oral candidiasis. [facility]. Provided patient with 2 [name] samples to cover her until she can be seen there.   Migraine: patient reported history of migraine, currently uncontrolled based on patient report of frequent migraines while using ibuprofen and Excedrin Migraine  for migraine treatment. Due to frequent medication use, could have rebound headaches which are contributing to increased frequency. Frequent migraines throughout the month (8-10/month) and would benefit from prophylactic treatment. Will initiate amitriptyline 25 mg nightly, then increase to 50 mg nightly after 1 week, and then finally increase to 75 mg nightly after 1 week. Counseled on adverse effects, including sedation and mood changes. Discussed this plan with Dr. Jennette KettleNeal.  Consider use of triptan for treatment in the future (such as frovatriptan for menstrual-associated migraine), however patient cannot afford at this time.   Patient verbalized understanding education.  Written pt instructions provided.  F[facility].  Total time in face to face counseling 30 minutes.  Patient Andria MeuseSimone Carden, PharmD Candidate and Juanita CraverStacey Karl, PharmD, BCPS resident.

## 2014-10-31 NOTE — Assessment & Plan Note (Signed)
Migraine: patient reported history of migraine, currently uncontrolled based on patient report of frequent migraines while using ibuprofen and Excedrin Migraine for migraine treatment. Due to frequent medication use, could have rebound headaches which are contributing to increased frequency. Frequent migraines throughout the month (8-10/month) and would benefit from prophylactic treatment. Will initiate amitriptyline 25 mg nightly, then increase to 50 mg nightly after 1 week, and then finally increase to 75 mg nightly after 1 week. Counseled on adverse effects, including sedation and mood changes. Discussed this plan with Dr. Jennette KettleNeal.  Consider use of triptan for treatment in the future (such as frovatriptan for menstrual-associated migraine), however patient cannot afford at this time.

## 2014-11-01 NOTE — Progress Notes (Signed)
Patient ID: Donnie MesaKatrina P Held, female   DOB: 1971/03/28, 44 y.o.   MRN: [id number] Reviewed: Agree with Dr. Macky LowerKoval's documentation and management.

## 2014-11-13 ENCOUNTER — Telehealth: Payer: Self-pay | Admitting: Family Medicine

## 2014-11-13 NOTE — Telephone Encounter (Signed)
Has poison ivy and is concerned about her grand baby getting it, wants to know what else she can do?

## 2014-11-13 NOTE — Telephone Encounter (Signed)
Pt informed that there is no guarantee that the baby will not get it.  Advised to avoid contact with child, but she is main caregiver, so she will "limit" contact as much as possible.  [place], Maryjo [place]

## 2015-01-04 ENCOUNTER — Encounter: Payer: Self-pay | Admitting: Obstetrics & Gynecology

## 2015-01-04 ENCOUNTER — Ambulatory Visit (INDEPENDENT_AMBULATORY_CARE_PROVIDER_SITE_OTHER): Payer: Self-pay | Admitting: Obstetrics & Gynecology

## 2015-01-04 VITALS — BP 130/90 | HR 80 | Wt 157.0 lb

## 2015-01-04 DIAGNOSIS — N938 Other specified abnormal uterine and vaginal bleeding: Secondary | ICD-10-CM

## 2015-01-04 MED ORDER — MEDROXYPROGESTERONE ACETATE 10 MG PO TABS: 10.0000 mg | ORAL_TABLET | Freq: Every day | ORAL | Status: DC

## 2015-01-04 NOTE — Progress Notes (Signed)
Patient ID: CHRISETTE MAN, female   DOB: 1970/06/19, 44 y.o.   MRN: [id number] Patient ID: ANDRES ESCANDON, female   DOB: 04/01/1971, 44 y.o.   MRN: [id number] Pt has been taking the provera 10 mg daily and has had very little bleeding She states it is "significantly" improved  Wants to continue on it Has had an ablation in the past  Blood pressure 130/90, pulse 80, weight 157 lb (71.215 kg).  Continue provera 10 mg but will decrease to first 10 days of the month We will see if this is enough to manage her bleeding     Face to face time:  15 minutes  Greater than 50% of the visit time was spent in counseling and coordination of care with the patient.  The summary and outline of the counseling and care coordination is summarized in the note above.   All questions were answered.

## 2015-01-24 ENCOUNTER — Other Ambulatory Visit: Payer: Self-pay | Admitting: Family Medicine

## 2015-01-24 DIAGNOSIS — Z1231 Encounter for screening mammogram for malignant neoplasm of breast: Secondary | ICD-10-CM

## 2015-02-12 ENCOUNTER — Ambulatory Visit (HOSPITAL_COMMUNITY): Payer: Self-pay

## 2015-02-23 ENCOUNTER — Other Ambulatory Visit: Payer: Self-pay | Admitting: Family Medicine

## 2015-02-26 ENCOUNTER — Other Ambulatory Visit: Payer: Self-pay | Admitting: Family Medicine

## 2015-02-26 DIAGNOSIS — Z1231 Encounter for screening mammogram for malignant neoplasm of breast: Secondary | ICD-10-CM

## 2015-03-12 ENCOUNTER — Ambulatory Visit (HOSPITAL_COMMUNITY)
Admission: RE | Admit: 2015-03-12 | Discharge: 2015-03-12 | Disposition: A | Discharge status: Home / Self Care | Payer: Self-pay | Source: Ambulatory Visit | Attending: Family Medicine | Admitting: Family Medicine

## 2015-03-12 DIAGNOSIS — Z1231 Encounter for screening mammogram for malignant neoplasm of breast: Secondary | ICD-10-CM | POA: Insufficient documentation

## 2015-03-15 ENCOUNTER — Encounter: Payer: Self-pay | Admitting: Family Medicine

## 2015-03-16 ENCOUNTER — Telehealth: Payer: Self-pay | Admitting: Family Medicine

## 2015-03-16 NOTE — Telephone Encounter (Signed)
Pt called because she was given some sample medications to try for her asthma. She is almost out and wanted to know is she going to get a prescription or more samples? Please let her know. [email or web address]

## 2015-03-19 NOTE — Telephone Encounter (Signed)
Called patient and left message to return call on home phone.  Requested call back to discuss additional samples (provided direct line).   Attempted call to cell - no answer.

## 2015-03-20 NOTE — Telephone Encounter (Signed)
Agree with plan to use samples in this patient given insurance challenges.

## 2015-03-20 NOTE — Telephone Encounter (Signed)
Left message for patient to return phone call regarding samples for asthma.  Patient can speak with Dr. Raymondo BandKoval if needed.  Clovis PuMartin, Tamika L, RN

## 2015-03-20 NOTE — Telephone Encounter (Signed)
[facility] and Rx for [facility] would be ready tomorrow afternoon.  Patient also wanted to get a Rx for Imitrex at least 4 or 5 tablets to help with migraines.  Patient stated she [facility] the [facility] at [facility] in [place] that it would cost her about $44.  She is really hoping she could get a Rx for the Imitrex.  Clovis PuMartin, Ad Guttman L, RN

## 2015-03-20 NOTE — Telephone Encounter (Signed)
I will plan to print Rx for neb tubing and gather samples tomorrow (10/26) and have them ready for the patient to pick up later in the afternoon.  Erasmo DownerAngela M Bacigalupo, MD, MPH PGY-2,   Family Medicine 03/20/2015 1:34 PM

## 2015-03-20 NOTE — Telephone Encounter (Signed)
Patient called back stating she was told that she could not be a patient at [facility].  She still does not have insurance and her husband is trying to find a new job.  She does have the [facility] 100% discount.  Advised patient to call the Outpatient pharmacy to see if she could use the discount there.  Patient also informed of the MAP; however [place].  [facility]ulera does help her with her asthma.  She would like more samples if possible until she could work something out about insurance.  She also requested a Rx for nebulizer tubing.  She went to [facility] pharmacy because they sell the tubing for $3; but she will need a prescription from her doctor in order to get it.  Please advise. Clovis PuMartin, Khadejah Son L, RN

## 2015-03-21 ENCOUNTER — Telehealth: Payer: Self-pay | Admitting: Family Medicine

## 2015-03-21 MED ORDER — NEBULIZER AIR TUBE/PLUGS MISC: Status: DC

## 2015-03-21 MED ORDER — SUMATRIPTAN SUCCINATE 50 MG PO TABS: 50.0000 mg | ORAL_TABLET | ORAL | Status: DC | PRN

## 2015-03-21 MED ORDER — MOMETASONE FURO-FORMOTEROL FUM 200-5 MCG/ACT IN AERO: 2.0000 | INHALATION_SPRAY | Freq: Two times a day (BID) | RESPIRATORY_TRACT | Status: DC

## 2015-03-21 NOTE — Telephone Encounter (Signed)
Please call back about medication

## 2015-03-21 NOTE — Telephone Encounter (Signed)
Samples of dulera (3 inhalers) and Rx for nebulizer tubing left at front desk for pick up. Rx for imitrex tablets sent to pharmacy.  Erasmo DownerAngela M Trenace Coughlin, MD, MPH PGY-2,  [facility] 03/21/2015 12:02 PM

## 2015-03-21 NOTE — Addendum Note (Signed)
Addended by: Erasmo DownerBACIGALUPO, Rynlee Lisbon M on: 03/21/2015 12:02 PM   Modules accepted: Orders

## 2015-03-21 NOTE — Telephone Encounter (Signed)
[facility] for [facility].  Patient also stated [facility] to [facility] price of the [facility].  Per patient it would be checker for her to go to the Outpatient pharmacy for the Imitrex.  The cost or 9 or 10 tablets would be $11.  Please send Rx for [facility]rex [facility].  She will be able to pick up medication this Friday.  Clovis PuMartin, Kamren Heintzelman L, RN

## 2015-03-22 MED ORDER — SUMATRIPTAN SUCCINATE 50 MG PO TABS: 50.0000 mg | ORAL_TABLET | ORAL | Status: DC | PRN

## 2015-03-22 NOTE — Addendum Note (Signed)
Addended by: Erasmo DownerBACIGALUPO, Sanaia Jasso M on: 03/22/2015 09:00 AM   Modules accepted: Orders

## 2015-03-22 NOTE — Telephone Encounter (Signed)
Rx for imitrex sent to [facility].  Erasmo DownerAngela M Bacigalupo, MD, MPH PGY-2,  [facility] 03/22/2015 9:00 AM

## 2015-03-23 ENCOUNTER — Ambulatory Visit (INDEPENDENT_AMBULATORY_CARE_PROVIDER_SITE_OTHER): Payer: Self-pay | Admitting: *Deleted

## 2015-03-23 DIAGNOSIS — Z23 Encounter for immunization: Secondary | ICD-10-CM

## 2015-04-09 ENCOUNTER — Other Ambulatory Visit: Payer: Self-pay | Admitting: Family Medicine

## 2015-04-18 ENCOUNTER — Ambulatory Visit (INDEPENDENT_AMBULATORY_CARE_PROVIDER_SITE_OTHER): Payer: Self-pay | Admitting: Student

## 2015-04-18 ENCOUNTER — Encounter: Payer: Self-pay | Admitting: Student

## 2015-04-18 VITALS — BP 146/78 | HR 108 | Temp 97.8°F | Wt 159.0 lb

## 2015-04-18 DIAGNOSIS — R21 Rash and other nonspecific skin eruption: Secondary | ICD-10-CM

## 2015-04-18 NOTE — Patient Instructions (Addendum)
Follow up in one week If your rash gets worse, you develop fevers or chills, call the office If you have questions or concerns, please call the office

## 2015-04-20 DIAGNOSIS — R21 Rash and other nonspecific skin eruption: Secondary | ICD-10-CM | POA: Insufficient documentation

## 2015-04-20 NOTE — Assessment & Plan Note (Addendum)
Rash similar in appearance to a bruise, however also differential also include erythema nodosum, however this is unliklely given single lesion, else insect bite or other trauma - Will continue to manage conservatively for now and monitor for changes - return in 1 week if improvement - if rash still present obtain CBC, PT/INR to ensure clotting function wnl

## 2015-04-20 NOTE — Progress Notes (Signed)
   Subjective:    Patient ID: Marie Webb, female    DOB: [birth year], 44 y.o.   MRN: [id number]   CC: rash on right arm  HPI 44 y/o presenting with rash on right arm noticed 5 days ago when it was brought to her attention by her husband  Rash - Rash noted initially by her husband - she feels it looks like a bruise but she does not remember any trauma to the area, or bug bites - it was initially small then then started to widen - does report right arm soreness that started 1 week ago, she can think of no strain or trauma to the arm - She denies itching, does have some soreness when it is palpated - she denies ever having a rash like this before and denies any other similar lesions on her body - denies fevers, chest pain, weakness, SOB, recent illness   Review of Systems   See HPI for ROS.   Past Medical History  Diagnosis Date  . Asthma    Past Surgical History  Procedure Laterality Date  . Endometrial ablation     OB History    No data available     Social History   Social History  . Marital Status: Married    Spouse Name: N/A  . Number of Children: N/A  . Years of Education: N/A   Occupational History  . Not on file.   Social History Main Topics  . Smoking status: Never Smoker   . Smokeless tobacco: Not on file  . Alcohol Use: No  . Drug Use: No  . Sexual Activity: Not on file   Other Topics Concern  . Not on file   Social History Narrative    Objective:  BP 146/78 mmHg  Pulse 108  Temp(Src) 97.8 F (36.6 C) (Oral)  Wt 159 lb (72.122 kg) Vitals and nursing note reviewed  General: NAD Cardiac: RRR, no murmurs auscultated Respiratory: CTAB, normal effort Abdomen: soft, nontender, nondistended, Extremities: well circumscribed dark brown lesion with periphery fading to yellow, diameter approx 3 cm, mild tenderness to palpation but no gaurding, 5/5 strength of B/l UE. Mild tenderness to palpation of RUE diffusely. No RUE or LUE swelling Skin:  warm dry, rash as noted above Neuro: alert and oriented, no focal deficits   Assessment & Plan:    Rash and nonspecific skin eruption Rash similar in appearance to a bruise, however also differential also include erythema nodosum, however this is unliklely given single lesion, else insect bite or other trauma - Will continue to manage conservatively for now and monitor for changes - return in 1 week if improvement - if rash still present obtain CBC, PT/INR to ensure clotting function wnl     Kandance Yano A. Kennon RoundsHaney MD, MS Family Medicine Resident PGY-1 Pager [phone]

## 2015-06-01 ENCOUNTER — Ambulatory Visit (HOSPITAL_COMMUNITY)
Admission: RE | Admit: 2015-06-01 | Discharge: 2015-06-01 | Disposition: A | Discharge status: Home / Self Care | Payer: Self-pay | Source: Ambulatory Visit | Attending: Family Medicine | Admitting: Family Medicine

## 2015-06-01 ENCOUNTER — Encounter: Payer: Self-pay | Admitting: Family Medicine

## 2015-06-01 ENCOUNTER — Ambulatory Visit (INDEPENDENT_AMBULATORY_CARE_PROVIDER_SITE_OTHER): Payer: Self-pay | Admitting: Family Medicine

## 2015-06-01 VITALS — BP 121/85 | HR 117 | Temp 97.9°F | Ht 64.5 in | Wt 156.9 lb

## 2015-06-01 DIAGNOSIS — M25572 Pain in left ankle and joints of left foot: Secondary | ICD-10-CM

## 2015-06-01 DIAGNOSIS — M79672 Pain in left foot: Secondary | ICD-10-CM | POA: Insufficient documentation

## 2015-06-01 NOTE — Patient Instructions (Signed)
Thank you so much for coming to visit me today! We will obtain xrays of your left foot and right foot (for comparison). I will contact you with the results. I suspect you might have a minor sprain from your fall. You may treat this with compression (ACE wrap or brace), Ice, and elevation.  You may take Ibuprofen or Aleve for pain as needed.  Thanks again! Dr. Caroleen Hammanumley

## 2015-06-03 DIAGNOSIS — M79672 Pain in left foot: Secondary | ICD-10-CM

## 2015-06-03 HISTORY — DX: Pain in left foot: M79.672

## 2015-06-03 NOTE — Assessment & Plan Note (Addendum)
-   Xrays obtained given chronic nature of pain and concern of medial ankle sprain. Xrays reviewed and show bilateral os naviculare with fracture unlikely.  - Chronic pain possibly secondary to collapse of transverse arch - Rest, Ice, Compression, Elevation - NSAIDs for acute pain - Follow up at [facility] for further evaluation and orthotics.

## 2015-06-03 NOTE — Progress Notes (Signed)
Subjective:     Patient ID: Marie Webb, female   DOB: 1970-12-24, 45 y.o.   MRN: [id number]  HPI [name] is a 45yo female presenting today for left foot pain. - Has noted medial left pain over top of foot for 8-5210months. Does not recall injury or trauma, however admits she could have hit it on something at work without realizing it. - Worse with walking on the beach - Notes that a bone seems to stick out further on her left foot than her right - Fell yesterday and twisted foot with pronation. Noted mild worsening of medial left foot pain at that time. Has been limping since that time, however was able to bear weight immediately after injury. - Has not tried any medications for pain - Denies swelling, knee pain, change in gait except after acute injury yesterday  Review of Systems Per HPI, otherwise negative    Objective:   Physical Exam  Constitutional: She appears well-developed and well-nourished. No distress.  Cardiovascular: Normal rate and regular rhythm.  Exam reveals no gallop and no friction rub.   No murmur heard. Pulmonary/Chest: Effort normal. No respiratory distress. She has no wheezes.  Musculoskeletal: She exhibits no edema.  Ankles symmetric bilaterally without effusion. No tenderness noted over malleoli or plantar surfaces of navicular and cuboid bones. Tenderness over left dorsal surface of navicular and medial cuboid. Anterior drawer and talar tilt negative, however possibly affected by acute ankle injury. Collapse of transverse arch noted bilaterally with weight bearing.   Psychiatric: She has a normal mood and affect. Her behavior is normal.      Assessment and Plan:     Left foot pain - Xrays obtained given chronic nature of pain and concern of medial ankle sprain. Xrays reviewed and show bilateral os naviculare with fracture unlikely.  - Chronic pain possibly secondary to collapse of transverse arch - Rest, Ice, Compression, Elevation - NSAIDs for  acute pain - Follow up at [facility] for further evaluation and orthotics.

## 2015-06-04 ENCOUNTER — Ambulatory Visit (INDEPENDENT_AMBULATORY_CARE_PROVIDER_SITE_OTHER): Payer: Self-pay | Admitting: Sports Medicine

## 2015-06-04 ENCOUNTER — Ambulatory Visit: Payer: Self-pay | Admitting: Family Medicine

## 2015-06-04 ENCOUNTER — Encounter: Payer: Self-pay | Admitting: Sports Medicine

## 2015-06-04 VITALS — BP 142/94 | Ht 64.5 in | Wt 145.0 lb

## 2015-06-04 DIAGNOSIS — Q6689 Other  specified congenital deformities of feet: Secondary | ICD-10-CM

## 2015-06-04 DIAGNOSIS — Q742 Other congenital malformations of lower limb(s), including pelvic girdle: Secondary | ICD-10-CM

## 2015-06-04 NOTE — Patient Instructions (Signed)
You have an extra bone in your foot (it's called an os naviculre)  Try using Aspercreme when it is sore  Make sure you wear tennis shoes with good arch support if you are going to be walking for an extended period of time  If it ever becomes painful enough for surgical excision you can let me know. Otherwise, I will suggest conservative treatment.

## 2015-06-04 NOTE — Progress Notes (Signed)
   Subjective:    Patient ID: Marie Webb, female    DOB: December 14, 1970, 45 y.o.   MRN: [id number]  HPI chief complaint: Left foot pain  Very pleasant 45 year old female comes in today complaining of medial sided left foot pain. She has been having pain in the area of her navicular for the past several months which became acutely worse last week when she slipped and struck the medial aspect of her foot on the floor. She was seen at the family practice office and x-rays of both feet were obtained. She was then referred to our office for further workup and treatment. She states that her pain is more of a nuisance than anything. It is most noticeable with direct pressure. She has been able to walk without any significant discomfort although she does have increasing pain with prolonged walking. She tends to walk in flip-flops and does not wear any sort of supportive shoe. She denies numbness or tingling. No prior trauma to the foot. No prior foot surgery.  Past history reviewed Medications reviewed Allergies reviewed    Review of Systems    as above Objective:   Physical Exam  Well-developed, well-nourished. No acute distress  Left foot: Fairly well-preserved longitudinal arch. Collapse of the transverse arch. No tenderness to palpation along the metatarsal heads. There is tenderness as well as some mild swelling over the medial navicular. Posterior tibialis tendon is intact. No other bony or soft tissue tenderness to direct palpation. Neurovascularly intact distally. Walking without a significant limp.  X-rays of both feet are reviewed. Each foot has an os navicularis. No fracture is seen      Assessment & Plan:  Left foot pain secondary to symptomatic os navicularis  Given the patient's normal gait I do not think we need to immobilize this. Instead, I recommended over-the-counter Aspercreme to use as needed. She also needs to start wearing a good supportive tennis shoe when standing or  walking for long periods of time. If symptoms persist or worsen then consideration could be made for resection of this symptomatic os navicularis. If that is the case then I would have her see Marie Webb in consultation. Otherwise follow-up as needed.

## 2015-06-05 ENCOUNTER — Telehealth: Payer: Self-pay | Admitting: Pharmacist

## 2015-06-05 NOTE — Telephone Encounter (Signed)
Phone call request to call patient on answering machine returned to patient.   She reports taking amitriptyline 50mg  QPM and [name] 2 inhalations BID.   She had [facility] inh several months ago then three months ago she was increased to 2 inh BID.   For the last two months she has noticed arm weakness and aching pain.   She inquires if this is related to her [facility] or her amitriptyline.   She states she finds it difficult to carry a bag of groceries.   Of note, patient is transitioning insurance from [facility] 100% to her husband's insurance this coming week.  She had been receiving the [facility] as a sample.   Insurance may require a change with inhaler therapy and should be covered without continuation of current therapy.   Her breathing has been much improved by her report on the [facility].  Patient attempted to reschedule with PCP following visit rescheduling due to weather yesterday however next available was 1/31 (three weeks away).     Asked to be evaluated by provider in office this coming week and consideration for switch therapy for possible steroid myopathy could be considered. Alternative workup could also be done at that time.    Asked to call back tomorrow AM and ask for work-in appointment within the next few days.   Patient was appreciative and comfortable with that plan.

## 2015-06-06 NOTE — Telephone Encounter (Signed)
Noted and agree. 

## 2015-06-08 ENCOUNTER — Encounter: Payer: Self-pay | Admitting: Family Medicine

## 2015-06-08 ENCOUNTER — Ambulatory Visit (INDEPENDENT_AMBULATORY_CARE_PROVIDER_SITE_OTHER): Payer: Self-pay | Admitting: Family Medicine

## 2015-06-08 ENCOUNTER — Ambulatory Visit: Payer: Self-pay | Admitting: Family Medicine

## 2015-06-08 VITALS — BP 142/97 | HR 106 | Temp 98.3°F | Ht 64.5 in | Wt 160.6 lb

## 2015-06-08 DIAGNOSIS — M79602 Pain in left arm: Secondary | ICD-10-CM

## 2015-06-08 DIAGNOSIS — M79601 Pain in right arm: Secondary | ICD-10-CM

## 2015-06-08 DIAGNOSIS — J45909 Unspecified asthma, uncomplicated: Secondary | ICD-10-CM

## 2015-06-08 HISTORY — DX: Pain in right arm: M79.601

## 2015-06-08 HISTORY — DX: Pain in left arm: M79.602

## 2015-06-08 MED ORDER — MOMETASONE FURO-FORMOTEROL FUM 200-5 MCG/ACT IN AERO: 2.0000 | INHALATION_SPRAY | Freq: Two times a day (BID) | RESPIRATORY_TRACT | Status: DC

## 2015-06-08 NOTE — Assessment & Plan Note (Signed)
Patient is here with vague complaints of bilateral arm pain. Etiology currently unknown. Symptom onset approximately 10 months ago. Pain is described as achy in nature that can wax and wane depending on the day. RC intact with 5/5 strength throughout. Unable to reproduce any symptoms with Spurling's. No history of trauma. No neck pain or stiffness. - Patient is likely experiencing some symptoms of deconditioning. She endorses weight gain over the past year, as well as avoidance of upper extremity exercise. I believe at this time that patient would benefit greatly from some sessions of physical therapy, with a plan to continue exercise regimen long-term with an at-home program. - No evidence on physical exam suggestive of neurologic etiology warranting imaging at this time. - Patient's symptoms have been ongoing for 10 months. There was some discussion of possible allergic reaction to [facility] medication. I believe that this is highly unlikely due to the onset of symptoms (10 mth) versus the onset of this medication (3mth).

## 2015-06-08 NOTE — Patient Instructions (Signed)
It was a pleasure seeing you today in our clinic. Today we discussed your arm/shoulder weakness. Here is the treatment plan we have discussed and agreed upon together:   - At this time I do not believe that there is any significant nerve or vascular issue. I believe that with some extra physical activity focusing on your upper extremities you may have some improvement in her symptoms. Also, with your symptoms initiating prior to the medication of concern [facility]([name]) I have a very low suspicion that this is the cause of your symptoms at this time. - I've placed in order for physical therapy. He will be contacted regarding an appointment.

## 2015-06-08 NOTE — Progress Notes (Signed)
   HPI  CC: muscle pain/weakness Pain of bilateral arms. Been ongoing for ~10 months. Aching. Pain is similar to muscle soreness, but has not been exercising. Wakes up w/ both arms asleep, and sometimes occur when awake. No trauma. Minimal neck pain. Nothing makes worse. "Weakness" is reported due to items feeling "heavier than they use to". Has not been dropping items. Originally thought could be from [facility] but this was increased after sx onset.  ROS: Denies fevers, chills, neck pain, neck stiffness, back pain, paresthesias, shoulder pain, trauma, injury, gait changes, chest pain, jaw pain, reflux, shortness of breath.  Past medical history and social history reviewed and updated in the EMR as appropriate.  Objective: BP 142/97 mmHg  Pulse 106  Temp(Src) 98.3 F (36.8 C) (Oral)  Ht 5' 4.5" (1.638 m)  Wt 160 lb 9.6 oz (72.848 kg)  BMI 27.15 kg/m2 Gen: NAD, alert, cooperative, and pleasant. Speaking at an alarming (almost manic) rate. HEENT: NCAT, FROM neck, no C-spine tenderness Upper Ext: No edema, warm, strength 5/5 bilaterally, sensation intact throughout, Spurling's test negative. DTRs equal bilaterally. Range of motion full throughout. Cap refill <1 bilaterally.  Neuro: Alert and oriented, Speech clear  Assessment and plan:  Bilateral arm pain Patient is here with vague complaints of bilateral arm pain. Etiology currently unknown. Symptom onset approximately 10 months ago. Pain is described as achy in nature that can wax and wane depending on the day. RC intact with 5/5 strength throughout. Unable to reproduce any symptoms with Spurling's. No history of trauma. No neck pain or stiffness. - Patient is likely experiencing some symptoms of deconditioning. She endorses weight gain over the past year, as well as avoidance of upper extremity exercise. I believe at this time that patient would benefit greatly from some sessions of physical therapy, with a plan to continue exercise regimen  long-term with an at-home program. - No evidence on physical exam suggestive of neurologic etiology warranting imaging at this time. - Patient's symptoms have been ongoing for 10 months. There was some discussion of possible allergic reaction to [facility] medication. I believe that this is highly unlikely due to the onset of symptoms (10 mth) versus the onset of this medication (3mth).  Asthma Patient requiring medication refill.    Orders Placed This Encounter  Procedures  . Ambulatory referral to Physical Therapy    Referral Priority:  Routine    Referral Type:  Physical Medicine    Referral Reason:  Specialty Services Required    Requested Specialty:  Physical Therapy    Number of Visits Requested:  1    Meds ordered this encounter  Medications  . mometasone-formoterol ([name]) 200-5 MCG/ACT AERO    Sig: Inhale 2 puffs into the lungs 2 (two) times daily.    Dispense:  1 Inhaler    Refill:  0    Lot [id number], [id number]    Order Specific Question:  Lot Number?    Answer:  [id number]    Order Specific Question:  Expiration Date?    Answer:  12/25/2015    Order Specific Question:  Manufacturer?    Answer:  [employer] [[id number]]    Order Specific Question:  Quantity    Answer:  1     Kathee DeltonIan D McKeag, MD,MS,  PGY2 06/08/2015 6:59 PM

## 2015-06-08 NOTE — Assessment & Plan Note (Signed)
Patient requiring medication refill.

## 2015-06-20 ENCOUNTER — Ambulatory Visit: Payer: 59

## 2015-06-27 ENCOUNTER — Encounter: Payer: Self-pay | Admitting: Family Medicine

## 2015-06-27 ENCOUNTER — Ambulatory Visit (INDEPENDENT_AMBULATORY_CARE_PROVIDER_SITE_OTHER): Payer: 59 | Admitting: Family Medicine

## 2015-06-27 VITALS — BP 128/78 | HR 105 | Temp 98.7°F | Ht 63.0 in | Wt 160.0 lb

## 2015-06-27 DIAGNOSIS — F39 Unspecified mood [affective] disorder: Secondary | ICD-10-CM | POA: Diagnosis not present

## 2015-06-27 DIAGNOSIS — Z800 Family history of malignant neoplasm of digestive organs: Secondary | ICD-10-CM

## 2015-06-27 DIAGNOSIS — J45909 Unspecified asthma, uncomplicated: Secondary | ICD-10-CM

## 2015-06-27 DIAGNOSIS — R4586 Emotional lability: Secondary | ICD-10-CM

## 2015-06-27 DIAGNOSIS — Z01419 Encounter for gynecological examination (general) (routine) without abnormal findings: Secondary | ICD-10-CM | POA: Insufficient documentation

## 2015-06-27 DIAGNOSIS — M79602 Pain in left arm: Secondary | ICD-10-CM

## 2015-06-27 DIAGNOSIS — M79601 Pain in right arm: Secondary | ICD-10-CM

## 2015-06-27 DIAGNOSIS — Z0000 Encounter for general adult medical examination without abnormal findings: Secondary | ICD-10-CM | POA: Diagnosis not present

## 2015-06-27 LAB — CK: Total CK: 45 U/L (ref 7–177)

## 2015-06-27 LAB — COMPLETE METABOLIC PANEL WITH GFR
ALBUMIN: 4.6 g/dL (ref 3.6–5.1)
ALK PHOS: 84 U/L (ref 33–115)
ALT: 14 U/L (ref 6–29)
AST: 13 U/L (ref 10–35)
BILIRUBIN TOTAL: 0.6 mg/dL (ref 0.2–1.2)
BUN: 13 mg/dL (ref 7–25)
CO2: 28 mmol/L (ref 20–31)
Calcium: 9.4 mg/dL (ref 8.6–10.2)
Chloride: 100 mmol/L (ref 98–110)
Creat: 0.67 mg/dL (ref 0.50–1.10)
GFR, Est African American: >89 mL/min (ref >=60)
GLUCOSE: 88 mg/dL (ref 65–99)
POTASSIUM: 4.1 mmol/L (ref 3.5–5.3)
SODIUM: 138 mmol/L (ref 135–146)
TOTAL PROTEIN: 7.6 g/dL (ref 6.1–8.1)

## 2015-06-27 LAB — CBC
HCT: 46.4 % — ABNORMAL HIGH (ref 36.0–46.0)
HEMOGLOBIN: 15.7 g/dL — AB (ref 12.0–15.0)
MCH: 30.1 pg (ref 26.0–34.0)
MCHC: 33.8 g/dL (ref 30.0–36.0)
MCV: 88.9 fL (ref 78.0–100.0)
MPV: 10.7 fL (ref 8.6–12.4)
PLATELETS: 313 10*3/uL (ref 150–400)
RBC: 5.22 MIL/uL — AB (ref 3.87–5.11)
RDW: 13.2 % (ref 11.5–15.5)
WBC: 8.7 10*3/uL (ref 4.0–10.5)

## 2015-06-27 LAB — LIPID PANEL
CHOL/HDL RATIO: 4.1 ratio (ref <=5.0)
Cholesterol: 249 mg/dL — ABNORMAL HIGH (ref 125–200)
HDL: 61 mg/dL (ref >=46)
LDL CALC: 151 mg/dL — AB (ref <130)
Triglycerides: 184 mg/dL — ABNORMAL HIGH (ref <150)
VLDL: 37 mg/dL — ABNORMAL HIGH (ref <30)

## 2015-06-27 LAB — POCT GLYCOSYLATED HEMOGLOBIN (HGB A1C): HEMOGLOBIN A1C: 5.1

## 2015-06-27 NOTE — Assessment & Plan Note (Signed)
No current exacerbation Advised to resume [name] 2 puffs BID

## 2015-06-27 NOTE — Patient Instructions (Signed)
Nice to see you again today. We are getting some labs and someone will call you or send you a letter with the results when they're available. Please resume taking your to wear as prescribed. That is to puffs twice a day. I do not think the symptoms you're having or medication related. The labs were getting today will rule out harmful causes of your symptoms.   Your next physical will be in one year.   Take care, Dr. B  Things to do to keep yourself healthy  - Exercise at least 30-45 minutes a day, 3-4 days a week.  - Eat a low-fat diet with lots of fruits and vegetables, up to 7-9 servings per day.  - Seatbelts can save your life. Wear them always.  - Smoke detectors on every level of your home, check batteries every year.  - Eye Doctor - have an eye exam every 1-2 years  - Safe sex - if you may be exposed to STDs, use a condom.  - Alcohol -  If you drink, do it moderately, less than 2 drinks per day.  - Health Care Power of Attorney. Choose someone to speak for you if you are not able.  - Depression is common in our stressful world.If you're feeling down or losing interest in things you normally enjoy, please come in for a visit.  - Violence - If anyone is threatening or hurting you, please call immediately.

## 2015-06-27 NOTE — Assessment & Plan Note (Signed)
UTD on pap smear CBC, CMET, lipids, a1c, hiv today Given information for colonoscopy as she is higher risk and needs earlier screening Next physical in 1 yr

## 2015-06-27 NOTE — Assessment & Plan Note (Signed)
Likely  Multifactorial. Could be related to anxiety, depression, thyroid dysfunction, perimenopausal state  Check TSH today

## 2015-06-27 NOTE — Progress Notes (Signed)
Subjective:   Marie Webb is a 45 y.o. female with a history of migraines, asthma here for well woman/preventative visit  Acute Concerns:  Mood swings - thinks she may be perimenopausal or have thyroid issues - always struggled with depression - now having mood swings - sad to mad to "frisky" - having monoclonic jerks that awaken her from sleep - pharmacist told her this could be related to perimenopause and she also decreased amitriptyline dose - had ablation for fibroids, so does not have menstrual periods - haas also gained a little weight and not eating differently - also under a lot of stress lately - doesn't want anti-depressants - having diarrhea three times weekly  Myalgias - arm soreness for several months - feels like she had lifting weights, but not doing anything strenuous - Pharmacy told her it could be related to dulera - backed down to 1 puff once daily from 2 puffs BID - been trying to stretch and strengthen - not affecting function - not taking any statins - wonders if she should see a chiropractor  Diet: doesn't eat junk food, drinks coffee in AM and doesn't eat until lunch (usually sandwich), eats a lot of pasta and breads  Exercise: stationary bike 5 times weekly 45 min/day  Sexual/Birth History: [id number], sexually active with husband  Birth Control: s/p BTL  POA/Living Will: not yet   Review of Systems: Per HPI.   PMH, PSH, Medications, Allergies, and FmHx reviewed and updated in EMR.  Social History: never smoker  Immunization:  Tdap/TD: 06/27/2014  Influenza: 03/23/15  Pneumococcal: n/a  Herpes Zoster: n/a  Cancer Screening:  Pap Smear: 06/30/13  Mammogram: n/a  Colonoscopy: mother had colon cancer in early fifties and died of it - will order today  Dexa: n/a   Objective:  BP 128/78 mmHg  Pulse 105  Temp(Src) 98.7 F (37.1 C) (Oral)  Ht  (1.6 m)  Wt 160 lb (72.576 kg)  BMI 28.35 kg/m2  SpO2 95%  Gen:  45 y.o. female in  NAD, anxious HEENT: NCAT, MMM, EOMI, PERRL, anicteric sclerae, TMs clear b/l Neck: Supple, no LAD, no thyromegaly CV: RRR, no MRG Resp: Non-labored, CTAB, no wheezes noted Abd: Soft, NTND, BS present, no guarding or organomegaly Ext: WWP, no edema MSK: Full ROM, strength intact, no deformities, TTP over large muscle groups of UEs, Spurlings negative Neuro: Alert and oriented, speech normal, sensation intact    Assessment & Plan:     Marie Webb is a 45 y.o. female here for annual well woman/preventative exam and GYN exam.  Asthma No current exacerbation Advised to resume [name] 2 puffs BID  Bilateral arm pain  Very vague complaints with unclear etiology. Strength and sensation intact. Negative Spurling's. No history of trauma. No neck pain or stiffness.  Likely multifactorial related to deconditioning and anxiety  Check TSH, CK, Cmet today to look for other causes Doubt it is medication  Induced given but it only involves upper extremities  Mood swings (HCC)  Likely  Multifactorial. Could be related to anxiety, depression, thyroid dysfunction, perimenopausal state  Check TSH today  Preventative health care UTD on pap smear CBC, CMET, lipids, a1c, hiv today Given information for colonoscopy as she is higher risk and needs earlier screening Next physical in 1 yr  Family history of colon cancer in mother Given information for colonoscopy as she is higher risk and needs earlier screening     Erasmo Downer, MD MPH PGY-2,  [facility] 06/27/2015  5:12 PM

## 2015-06-27 NOTE — Assessment & Plan Note (Signed)
Given information for colonoscopy as she is higher risk and needs earlier screening

## 2015-06-27 NOTE — Assessment & Plan Note (Signed)
Very vague complaints with unclear etiology. Strength and sensation intact. Negative Spurling's. No history of trauma. No neck pain or stiffness.  Likely multifactorial related to deconditioning and anxiety  Check TSH, CK, Cmet today to look for other causes Doubt it is medication  Induced given but it only involves upper extremities

## 2015-06-28 ENCOUNTER — Encounter: Payer: Self-pay | Admitting: Family Medicine

## 2015-06-28 ENCOUNTER — Telehealth: Payer: Self-pay | Admitting: Family Medicine

## 2015-06-28 LAB — HIV ANTIBODY (ROUTINE TESTING W REFLEX): HIV: NONREACTIVE

## 2015-06-28 LAB — TSH: TSH: 0.789 u[IU]/mL (ref 0.350–4.500)

## 2015-06-28 NOTE — Telephone Encounter (Signed)
Take [name] 2 puffs twice daily as prescribed.  Please let patient know. Thanks!  Erasmo Downer, MD, MPH PGY-2,  [facility] 06/28/2015 1:26 PM

## 2015-06-28 NOTE — Telephone Encounter (Signed)
Pt wants to know if she is suppose to continue to take dulera.  Dr b was to talk to dr Raymondo Band but was unable to do so before pt left yesterday

## 2015-06-28 NOTE — Telephone Encounter (Signed)
Pt called because her Marie Webb is not covered by her insurance since it is a tier 3, she is in tier 1 and they suggested at the pharmacy to go to Q-Var or something that is similar to that. Please let patient and call in the medication that is suited best for her. jw

## 2015-06-29 ENCOUNTER — Telehealth: Payer: Self-pay | Admitting: Family Medicine

## 2015-06-29 DIAGNOSIS — Z800 Family history of malignant neoplasm of digestive organs: Secondary | ICD-10-CM

## 2015-06-29 NOTE — Telephone Encounter (Signed)
Patient has been followed by Pharmacy clinic in the past for poorly controlled asthma.  From chart review, I was unable to find what she has tried before, but I will send this along to Dr. Raymondo Band to see if he can remember what has been tried before.  Perhaps he will have a better idea of which medication would be better covered by insurance.  During visit earlier this week, patient reported she was doing well on [name].  She had cut back from 2 puffs BID to 1 puff daily because she thought her arm pain was related to medication.  Advised her to take 2 puffs BID as prescribed.  Erasmo Downer, MD, MPH PGY-2,  [facility] 06/29/2015 9:45 AM

## 2015-06-29 NOTE — Telephone Encounter (Signed)
Referral placed. Thanks!  Erasmo Downer, MD, MPH PGY-2,  [facility] 06/29/2015 2:19 PM

## 2015-06-29 NOTE — Telephone Encounter (Signed)
Pt called and needs a referral to see Dr. Loreta Ave for her colonoscopy. jw

## 2015-06-29 NOTE — Telephone Encounter (Signed)
Patient with [facility], needs referral placed for insurance purposes.

## 2015-08-15 ENCOUNTER — Telehealth: Payer: Self-pay | Admitting: Family Medicine

## 2015-08-15 NOTE — Telephone Encounter (Signed)
Feels she needs to see a neurologist because of continuing neck and arm pain.  Would like a referral--doesn't know of any neurologist in the area.  Pt has seen dr Beryle Flockbacigalupo several times about this issue

## 2015-08-16 NOTE — Telephone Encounter (Signed)
I am unfamiliar with this patient and her issues. Will wait for Dr. Beryle FlockBacigalupo to return to place referral as indicated.

## 2015-08-20 NOTE — Telephone Encounter (Signed)
Patient has been seen twice in clinic for arm pain (b/l).  No clear etiology.  Neurologically intact.  Most likely musculoskeletal.  I can see no indication for neurology referral at this time. Patient can make f/u appt to discuss pain and be reevaluated with me or other providers as her schedule permits and we can discuss whether referral is indicated.  Please pass this along to the patient.  Marie DownerAngela M Kristyne Woodring, MD, MPH PGY-2,  [facility] 08/20/2015 1:52 PM

## 2015-08-28 NOTE — Telephone Encounter (Signed)
Spoke with patient and she said she will call back and make an appt that agrees with her schedule. Adelee Hannula Bruna PotterBlount, CMA

## 2015-08-31 LAB — HM COLONOSCOPY

## 2015-09-20 ENCOUNTER — Other Ambulatory Visit: Payer: Self-pay | Admitting: Family Medicine

## 2015-10-18 ENCOUNTER — Other Ambulatory Visit: Payer: Self-pay | Admitting: Family Medicine

## 2015-10-18 NOTE — Telephone Encounter (Signed)
Pt called because she needs the albuterol for her nebulizer called in. She is also needing [name] but she needs the cheaper version of this since [facility] is over 60.00 and she can not afford this. jw

## 2015-10-23 MED ORDER — MOMETASONE FURO-FORMOTEROL FUM 200-5 MCG/ACT IN AERO: 2.0000 | INHALATION_SPRAY | Freq: Two times a day (BID) | RESPIRATORY_TRACT | Status: DC

## 2015-11-06 ENCOUNTER — Other Ambulatory Visit: Payer: Self-pay | Admitting: *Deleted

## 2015-11-06 MED ORDER — ALBUTEROL SULFATE (2.5 MG/3ML) 0.083% IN NEBU: 2.5000 mg | INHALATION_SOLUTION | Freq: Four times a day (QID) | RESPIRATORY_TRACT | Status: DC | PRN

## 2015-11-21 ENCOUNTER — Other Ambulatory Visit: Payer: Self-pay | Admitting: Family Medicine

## 2015-11-21 MED ORDER — ALBUTEROL SULFATE (2.5 MG/3ML) 0.083% IN NEBU: 2.5000 mg | INHALATION_SOLUTION | Freq: Four times a day (QID) | RESPIRATORY_TRACT | Status: DC | PRN

## 2015-11-21 MED ORDER — ALBUTEROL SULFATE HFA 108 (90 BASE) MCG/ACT IN AERS: 1.0000 | INHALATION_SPRAY | Freq: Four times a day (QID) | RESPIRATORY_TRACT | Status: DC | PRN

## 2015-11-21 NOTE — Telephone Encounter (Signed)
Called patient and left VM.  These 3 medications are ICS.  Marie SleightDulera is a combination of LABA and ICS, so they are not equivalent.  They are only about half of the medication.  She should stay on [name].  Another refill of Albuterol sent to pharmacy.  Erasmo DownerAngela M Alphonza Tramell, MD, MPH PGY-2,  [facility] 11/21/2015 1:26 PM

## 2015-11-21 NOTE — Telephone Encounter (Signed)
Pt called because she said that [facility] did not receive the the albuterol we sent on 11/06/15. Also since Elwin SleightDulera is a tier 3 medication it cost over 60.00 a month. Her insurance said that there are 3 other ones that are tier 1 and will be 10.00. They are Asmenex, Q-Var, Alvesco. She would like the doctor to call one of these in. jw

## 2015-11-22 MED ORDER — MOMETASONE FURO-FORMOTEROL FUM 200-5 MCG/ACT IN AERO: 2.0000 | INHALATION_SPRAY | Freq: Two times a day (BID) | RESPIRATORY_TRACT | Status: DC

## 2015-11-22 MED ORDER — ALBUTEROL SULFATE (2.5 MG/3ML) 0.083% IN NEBU: 2.5000 mg | INHALATION_SOLUTION | Freq: Four times a day (QID) | RESPIRATORY_TRACT | Status: DC | PRN

## 2015-11-22 NOTE — Telephone Encounter (Signed)
Pt informed. Deseree Blount, CMA  

## 2015-11-22 NOTE — Telephone Encounter (Signed)
Refills sent. No coupons that I know of. Patient can google this. Please let her know.  Erasmo DownerAngela M Kimbery Harwood, MD, MPH PGY-2,  [facility] 11/22/2015 4:59 PM

## 2015-11-22 NOTE — Addendum Note (Signed)
Addended by: Erasmo DownerBACIGALUPO, ANGELA M on: 11/22/2015 05:00 PM   Modules accepted: Orders

## 2015-11-22 NOTE — Telephone Encounter (Signed)
Patient request refill for [facility] be sent in and would like to know if there are any coupons to help her with paying for it? Also, nebulizer solution is showing as a "print" RX and no e-scribed. Please correct this.

## 2015-11-23 ENCOUNTER — Telehealth: Payer: Self-pay | Admitting: Family Medicine

## 2015-11-23 MED ORDER — FLUTICASONE FUROATE-VILANTEROL 200-25 MCG/INH IN AEPB
1.0000 | INHALATION_SPRAY | Freq: Every day | RESPIRATORY_TRACT | Status: DC
Start: 2015-11-23 — End: 2016-03-18

## 2015-11-23 NOTE — Telephone Encounter (Signed)
Pt called back and would like us to disregard the message below. jw

## 2015-11-23 NOTE — Addendum Note (Signed)
Addended by: Erasmo DownerBACIGALUPO, Erica Osuna M on: 11/23/2015 11:11 AM   Modules accepted: Orders, Medications

## 2015-11-23 NOTE — Telephone Encounter (Signed)
Pt is calling again about her insurance who wants her to try Symbicort before they will pay for [facility]. Please call this today. j w

## 2015-12-18 ENCOUNTER — Encounter (HOSPITAL_COMMUNITY): Payer: Self-pay | Admitting: Emergency Medicine

## 2015-12-18 ENCOUNTER — Ambulatory Visit (HOSPITAL_COMMUNITY)
Admission: EM | Admit: 2015-12-18 | Discharge: 2015-12-18 | Disposition: A | Discharge status: Home / Self Care | Payer: 59 | Attending: Family Medicine | Admitting: Family Medicine

## 2015-12-18 DIAGNOSIS — S51812A Laceration without foreign body of left forearm, initial encounter: Secondary | ICD-10-CM | POA: Diagnosis not present

## 2015-12-18 MED ORDER — LIDOCAINE-EPINEPHRINE (PF) 2 %-1:200000 IJ SOLN
INTRAMUSCULAR | Status: AC
  Filled 2015-12-18: qty 20

## 2015-12-18 MED ORDER — TETANUS-DIPHTH-ACELL PERTUSSIS 5-2.5-18.5 LF-MCG/0.5 IM SUSP
INTRAMUSCULAR | Status: AC
  Filled 2015-12-18: qty 0.5

## 2015-12-18 MED ORDER — TETANUS-DIPHTH-ACELL PERTUSSIS 5-2.5-18.5 LF-MCG/0.5 IM SUSP: 0.5000 mL | Freq: Once | INTRAMUSCULAR | Status: DC

## 2015-12-18 MED ORDER — TETANUS-DIPHTH-ACELL PERTUSSIS 5-2.5-18.5 LF-MCG/0.5 IM SUSP
0.5000 mL | Freq: Once | INTRAMUSCULAR | Status: AC
  Administered 2015-12-18: 0.5 mL via INTRAMUSCULAR

## 2015-12-18 NOTE — ED Triage Notes (Signed)
PT cut her left forearm on a screw while working on a pool pump. Injury occurred at 2:30. No active bleeding. PT's tetanus shot is not up to date. Laceration is 1 inch long and open. Fatty tissue visible

## 2015-12-18 NOTE — Discharge Instructions (Signed)
Return 10 days for staple removal, sooner if any problems, keep clean and dry and bandaged for 2 days.

## 2015-12-18 NOTE — ED Provider Notes (Signed)
[facility]    CSN: [id number] Arrival date & time: 12/18/15  1530  First Provider Contact:  First MD Initiated Contact with Patient 12/18/15 1648        History   Chief Complaint Chief Complaint  Patient presents with  . Extremity Laceration    HPI Marie Webb is a 45 y.o. female.    Laceration  Location:  Shoulder/arm (cut on side of pool .) Shoulder/arm laceration location:  L forearm Length:  4.5cm Depth:  Cutaneous Quality: straight   Bleeding: controlled   Time since incident:  2 hours Laceration mechanism:  Metal edge and nail Pain details:    Quality:  Sharp   Severity:  Mild   Progression:  Unchanged Foreign body present:  No foreign bodies Relieved by:  None tried Ineffective treatments:  None tried Tetanus status:  Out of date Associated symptoms: no numbness and no redness     Past Medical History:  Diagnosis Date  . Asthma     Patient Active Problem List   Diagnosis Date Noted  . Mood swings (HCC) 06/27/2015  . Preventative health care 06/27/2015  . Family history of colon cancer in mother 06/27/2015  . Bilateral arm pain 06/08/2015  . Accessory navicular bone of foot 06/04/2015  . Left foot pain 06/03/2015  . Rash and nonspecific skin eruption 04/20/2015  . Migraine 10/31/2014  . Adjustment disorder with mixed anxiety and depressed mood 06/27/2014  . DUB (dysfunctional uterine bleeding) 03/02/2014  . Acute asthma exacerbation 06/18/2013  . Cough 06/18/2013  . Sinus tachycardia (HCC) 06/18/2013  . Asthma     Past Surgical History:  Procedure Laterality Date  . ENDOMETRIAL ABLATION      OB History    No data available       Home Medications    Prior to Admission medications   Medication Sig Start Date End Date Taking? Authorizing Provider  albuterol (PROVENTIL HFA;VENTOLIN HFA) 108 (90 Base) MCG/ACT inhaler Inhale 1-2 puffs into the lungs every 6 (six) hours as needed for wheezing or shortness of breath.  11/21/15   Erasmo Downer, MD  albuterol (PROVENTIL) (2.5 MG/3ML) 0.083% nebulizer solution Take 3 mLs (2.5 mg total) by nebulization every 6 (six) hours as needed for wheezing or shortness of breath. 11/22/15   Erasmo Downer, MD  amitriptyline (ELAVIL) 25 MG tablet TAKE THREE TABLETS BY MOUTH AT BEDTIME 09/20/15   Erasmo Downer, MD  aspirin-acetaminophen-caffeine (EXCEDRIN MIGRAINE) [phone] MG per tablet Take 2 tablets by mouth every 6 (six) hours as needed for headache or migraine.    Historical Provider, MD  fluticasone furoate-vilanterol (BREO ELLIPTA) 200-25 MCG/INH AEPB Inhale 1 puff into the lungs daily. 11/23/15   Erasmo Downer, MD  ibuprofen (ADVIL,MOTRIN) 200 MG tablet Take 800 mg by mouth every 6 (six) hours as needed for moderate pain.    Historical Provider, MD  medroxyPROGESTERone (PROVERA) 10 MG tablet Take 1 tablet (10 mg total) by mouth daily. 01/04/15   Lazaro Arms, MD  Respiratory Therapy Supplies (NEBULIZER AIR TUBE/PLUGS) MISC Nebulizer tubing 03/21/15   Erasmo Downer, MD  SUMAtriptan (IMITREX) 50 MG tablet Take 1 tablet (50 mg total) by mouth every 2 (two) hours as needed for migraine. May repeat in 2 hours if headache persists or recurs. 03/22/15   Erasmo Downer, MD    Family History Family History  Problem Relation Age of Onset  . Colon cancer Maternal Grandmother   . Colon cancer Mother   .  Asthma Other     Social History Social History  Substance Use Topics  . Smoking status: Never Smoker  . Smokeless tobacco: Not on file  . Alcohol use No     Allergies   Advair diskus [fluticasone-salmeterol] and Sertraline hcl   Review of Systems Review of Systems  Constitutional: Negative.   Musculoskeletal: Negative.   Skin: Positive for wound.  All other systems reviewed and are negative.    Physical Exam Triage Vital Signs ED Triage Vitals  Enc Vitals Group     BP 12/18/15 1650 153/91     Pulse Rate 12/18/15 1650 103      Resp 12/18/15 1650 16     Temp 12/18/15 1650 98.3 F (36.8 C)     Temp Source 12/18/15 1650 Oral     SpO2 12/18/15 1650 99 %     Weight --      Height --      Head Circumference --      Peak Flow --      Pain Score 12/18/15 1651 3     Pain Loc --      Pain Edu? --      Excl. in GC? --    No data found.   Updated Vital Signs BP 153/91   Pulse 103   Temp 98.3 F (36.8 C) (Oral)   Resp 16   SpO2 99%   Visual Acuity Right Eye Distance:   Left Eye Distance:   Bilateral Distance:    Right Eye Near:   Left Eye Near:    Bilateral Near:     Physical Exam  Constitutional: She is oriented to person, place, and time. She appears well-developed and well-nourished. No distress.  Neurological: She is alert and oriented to person, place, and time.  Skin:  Lac to volar left forearm.  Nursing note and vitals reviewed.    UC Treatments / Results  Labs (all labs ordered are listed, but only abnormal results are displayed) Labs Reviewed - No data to display  EKG  EKG Interpretation None       Radiology No results found.  Procedures .Marland KitchenLaceration Repair Date/Time: 12/18/2015 5:19 PM Performed by: Linna Hoff Authorized by: Bradd Canary D   Consent:    Consent obtained:  Verbal   Consent given by:  Patient   Risks discussed:  Infection Anesthesia (see MAR for exact dosages):    Anesthesia method:  Local infiltration   Local anesthetic:  Lidocaine 2% WITH epi Laceration details:    Location:  Shoulder/arm   Shoulder/arm location:  L lower arm   Length (cm):  4.5 Repair type:    Repair type:  Simple Pre-procedure details:    Preparation:  Patient was prepped and draped in usual sterile fashion Exploration:    Wound exploration: wound explored through full range of motion and entire depth of wound probed and visualized     Wound extent: no fascia violation noted, no foreign bodies/material noted, no muscle damage noted, no nerve damage noted and no tendon  damage noted     Contaminated: no   Treatment:    Area cleansed with:  Hibiclens   Amount of cleaning:  Extensive   Irrigation solution:  Sterile water   Irrigation method:  Syringe Skin repair:    Repair method:  Staples   Number of staples:  5 Approximation:    Approximation:  Close Post-procedure details:    Dressing:  Antibiotic ointment and sterile dressing   Patient tolerance  of procedure:  Tolerated well, no immediate complications   (including critical care time)  Medications Ordered in UC Medications  Tdap (BOOSTRIX) injection 0.5 mL (not administered)     Initial Impression / Assessment and Plan / UC Course  I have reviewed the triage vital signs and the nursing notes.  Pertinent labs & imaging results that were available during my care of the patient were reviewed by me and considered in my medical decision making (see chart for details).  Clinical Course      Final Clinical Impressions(s) / UC Diagnoses   Final diagnoses:  None    New Prescriptions New Prescriptions   No medications on file     Linna Hoff, MD 12/18/15 1730

## 2015-12-27 ENCOUNTER — Telehealth: Payer: Self-pay | Admitting: Family Medicine

## 2015-12-27 NOTE — Telephone Encounter (Signed)
Pt has an appointment tomorrow to have staples removed with Tamika and is concerned that the arm is sore where the staples are. Pt wants to know if it could be infected or if she should wait till Monday to get them removed. Staples were put in 9 days ago at urgent care. Please advise. [phone] Thanks! ep

## 2015-12-27 NOTE — Telephone Encounter (Signed)
Pt was advised. Pt stated she does not think it is infected anymore, thinks it could just be sore from bumping into things. Pt kept appointment for staple removal for tomorrow at 2:45. ep

## 2015-12-27 NOTE — Telephone Encounter (Signed)
Patient should be evaluated by a provider is she has concerns regarding the staples.  Patient will be charged for the removal since Family Medicine did not place the staples.  Clovis Pu, RN

## 2015-12-28 ENCOUNTER — Ambulatory Visit (INDEPENDENT_AMBULATORY_CARE_PROVIDER_SITE_OTHER): Payer: 59 | Admitting: *Deleted

## 2015-12-28 VITALS — Temp 98.7°F

## 2015-12-28 DIAGNOSIS — Z4802 Encounter for removal of sutures: Secondary | ICD-10-CM

## 2015-12-28 NOTE — Progress Notes (Signed)
   Patient in nurse clinic for staple removal of left forearm.  Staples were placed 12/18/15 at urgent care.  Five staples in place.  Area was hot to touch and red around the staples.  Precept with Dr. Gwendolyn Grant, remove the staples to assess area.  Five staples were removed without difficultly, but very tender for patient during the removal.  Patient to apply Bacitracin twice a day until Monday.  If redness worsen or swelling to call clinic for provider to call in oral antibiotics.  Patient verbalized understanding.  Clovis Pu, RN

## 2016-01-14 ENCOUNTER — Other Ambulatory Visit: Payer: Self-pay | Admitting: *Deleted

## 2016-01-14 MED ORDER — SUMATRIPTAN SUCCINATE 50 MG PO TABS: 50.0000 mg | ORAL_TABLET | ORAL | 1 refills | Status: DC | PRN

## 2016-02-06 ENCOUNTER — Other Ambulatory Visit: Payer: Self-pay | Admitting: Family Medicine

## 2016-03-10 ENCOUNTER — Other Ambulatory Visit: Payer: Self-pay | Admitting: Family Medicine

## 2016-03-10 DIAGNOSIS — Z1231 Encounter for screening mammogram for malignant neoplasm of breast: Secondary | ICD-10-CM

## 2016-03-17 ENCOUNTER — Ambulatory Visit (HOSPITAL_COMMUNITY)
Admission: RE | Admit: 2016-03-17 | Discharge: 2016-03-17 | Disposition: A | Discharge status: Home / Self Care | Payer: 59 | Source: Ambulatory Visit | Attending: Family Medicine | Admitting: Family Medicine

## 2016-03-17 DIAGNOSIS — Z1231 Encounter for screening mammogram for malignant neoplasm of breast: Secondary | ICD-10-CM | POA: Diagnosis not present

## 2016-03-18 ENCOUNTER — Encounter: Payer: Self-pay | Admitting: Family Medicine

## 2016-03-18 ENCOUNTER — Ambulatory Visit (INDEPENDENT_AMBULATORY_CARE_PROVIDER_SITE_OTHER): Payer: 59 | Admitting: Family Medicine

## 2016-03-18 VITALS — BP 158/104 | HR 97 | Temp 98.4°F | Wt 167.2 lb

## 2016-03-18 DIAGNOSIS — F514 Sleep terrors [night terrors]: Secondary | ICD-10-CM

## 2016-03-18 DIAGNOSIS — Z23 Encounter for immunization: Secondary | ICD-10-CM

## 2016-03-18 DIAGNOSIS — F4323 Adjustment disorder with mixed anxiety and depressed mood: Secondary | ICD-10-CM

## 2016-03-18 DIAGNOSIS — I10 Essential (primary) hypertension: Secondary | ICD-10-CM

## 2016-03-18 DIAGNOSIS — G43009 Migraine without aura, not intractable, without status migrainosus: Secondary | ICD-10-CM | POA: Diagnosis not present

## 2016-03-18 HISTORY — DX: Sleep terrors (night terrors): F51.4

## 2016-03-18 LAB — BASIC METABOLIC PANEL WITH GFR
BUN: 15 mg/dL (ref 7–25)
CALCIUM: 9.8 mg/dL (ref 8.6–10.2)
CO2: 28 mmol/L (ref 20–31)
Chloride: 102 mmol/L (ref 98–110)
Creat: 0.8 mg/dL (ref 0.50–1.10)
GFR, EST NON AFRICAN AMERICAN: 88 mL/min (ref >=60)
GFR, Est African American: >89 mL/min (ref >=60)
GLUCOSE: 90 mg/dL (ref 65–99)
POTASSIUM: 3.9 mmol/L (ref 3.5–5.3)
SODIUM: 140 mmol/L (ref 135–146)

## 2016-03-18 MED ORDER — FLUTICASONE FUROATE-VILANTEROL 200-25 MCG/INH IN AEPB: 1.0000 | INHALATION_SPRAY | Freq: Every day | RESPIRATORY_TRACT | 3 refills | Status: DC

## 2016-03-18 MED ORDER — SUMATRIPTAN SUCCINATE 50 MG PO TABS: 50.0000 mg | ORAL_TABLET | ORAL | 6 refills | Status: DC | PRN

## 2016-03-18 MED ORDER — AMLODIPINE BESYLATE 5 MG PO TABS: 5.0000 mg | ORAL_TABLET | Freq: Every day | ORAL | 3 refills | Status: DC

## 2016-03-18 MED ORDER — AMITRIPTYLINE HCL 25 MG PO TABS: 50.0000 mg | ORAL_TABLET | Freq: Every day | ORAL | 3 refills | Status: DC

## 2016-03-18 NOTE — Progress Notes (Signed)
Dr. Beryle FlockBacigalupo requested a Behavioral Health Consult.   Presenting Issue:  Negative thoughts about self, low mood intermittently   Report of symptoms:  Patient reported that she has some days when she has negative thoughts, such as thoughts that her husband doesn't love her, that she's ugly or fat, and other self-critical thoughts. These thoughts are distressing to her when they happen, and have caused some stress with her husband, though she reported that he has been supportive and reassuring when she has upsetting thoughts like this. She also reported that she often wakes up in the middle of the night yelling. This has not been a big problem and she is able to go back to sleep, but there have been times when it happens more frequently and patient is not sure why.   Duration of CURRENT symptoms:  She has noted the current symptoms over the "past few years"   Psychiatric History - Diagnoses: adjustment disorder with mixed anxiety and depressed mood, per chart - Hospitalizations: not assessed - Pharmacotherapy: none - Outpatient therapy: none  Family history of psychiatric issues:  Mother and grandmother had depression (full family history not assessed)  Current and history of substance use:  Not assessed  Medical conditions that might explain or contribute to symptoms:  Patient indicated that some of her negative thoughts surround her inability to work and contribute financially (patient reported that she is legally blind).   Assessment / Plan / Recommendations: Patient appears to be experiencing some depressive symptoms, the most bothersome of which includes negative thoughts about herself or others. Patient is hesitant to pursue pharmacological interventions at this point but is open to discussion about this if it is determined necessary. [facility] as one option for treatment and patient would like to try this. [facility]

## 2016-03-18 NOTE — Patient Instructions (Addendum)
Nice to see you again today. You can increase her amitriptyline to 50 mg daily at bedtime. Continue her other medications as prescribed.  Try DeBrox drops for the wax buildup in your ears.  Start taking amlodipine 5mg  daily for blood pressure.  Have daughter check BP daily.  Call me in 2 weeks with BP readings.  I will see you back in 1 month.  Take care, Dr. BLeonard Schwartz

## 2016-03-18 NOTE — Progress Notes (Signed)
   Subjective:   Bralyn Christel Mormon Mccollum is a 45 y.o. female with a history of asthma, adjustment disorder with mixed anxiety and depression here for depression  #Depression Intermittent days of depression, irrational thinking, feeling bad about self Thinks she is getting close to menopause Thinks this could be hormone changes Does not want to take any medication  Occurs ~1 day per week Never taken medications for depression Mother had MDD Has night terrors every night since mother died 4 yrs ago - has happened a few times all night long Exercise 45-4560min 5 days per week  Migraines - taking amitryptiline prophylactically - down to 3-4 migraines/month - takes imitrex for abortive therapy  BP - intermittently elevated in the past - never taken medications  Review of Systems:  Per HPI.   Social History: never smoker  Objective:  BP (!) 158/104   Pulse 97   Temp 98.4 F (36.9 C) (Oral)   Wt 167 lb 3.2 oz (75.8 kg)   BMI 29.62 kg/m   Gen:  45 y.o. female in NAD HEENT: NCAT, MMM, anicteric sclerae CV: RRR, no MRG Resp: Non-labored, CTAB, no wheezes noted Abd: Soft, NTND, BS present, no guarding or organomegaly Ext: WWP, no edema MSK: No obvious deformities, gait intact Neuro: Alert and oriented, speech normal      Chemistry      Component Value Date/Time   NA 138 06/27/2015 1607   K 4.1 06/27/2015 1607   CL 100 06/27/2015 1607   CO2 28 06/27/2015 1607   BUN 13 06/27/2015 1607   CREATININE 0.67 06/27/2015 1607      Component Value Date/Time   CALCIUM 9.4 06/27/2015 1607   ALKPHOS 84 06/27/2015 1607   AST 13 06/27/2015 1607   ALT 14 06/27/2015 1607   BILITOT 0.6 06/27/2015 1607      Lab Results  Component Value Date   WBC 8.7 06/27/2015   HGB 15.7 (H) 06/27/2015   HCT 46.4 (H) 06/27/2015   MCV 88.9 06/27/2015   PLT 313 06/27/2015   Lab Results  Component Value Date   TSH 0.789 06/27/2015   Lab Results  Component Value Date   HGBA1C 5.1 06/27/2015    Assessment & Plan:     Donnie MesaKatrina P Hulsey is a 45 y.o. female here for   Migraine Increase amitriptyline to 50 mg daily at bedtime Continue to use Imitrex as needed  Essential hypertension Blood pressures over the last 2 years have been consistently over 140/90 Start amlodipine 5 mg daily Check BMP today Follow-up blood pressure readings in 2 weeks (daughter will check blood pressure daily as she is a nurse) Follow-up in clinic in one month  Night terrors Not bothersome to patient Continue to follow  Adjustment disorder with mixed anxiety and depressed mood Suspect mood changes and low self-esteem are hormonal as patient is perimenopausal PHQ9 score of 7 today for feeling depressed, insomnia, feeling bad about herself No SI or HI [facility] consult and patient will continue to follow with them     Erasmo DownerAngela M Luci Bellucci, MD MPH PGY-3,  [facility] 03/18/2016  4:30 PM

## 2016-03-18 NOTE — Assessment & Plan Note (Signed)
Suspect mood changes and low self-esteem are hormonal as patient is perimenopausal PHQ9 score of 7 today for feeling depressed, insomnia, feeling bad about herself No SI or HI [facility] and patient will continue to follow with them

## 2016-03-18 NOTE — Assessment & Plan Note (Signed)
Not bothersome to patient Continue to follow

## 2016-03-18 NOTE — Assessment & Plan Note (Signed)
Increase amitriptyline to 50 mg daily at bedtime Continue to use Imitrex as needed

## 2016-03-18 NOTE — Assessment & Plan Note (Signed)
Blood pressures over the last 2 years have been consistently over 140/90 Start amlodipine 5 mg daily Check BMP today Follow-up blood pressure readings in 2 weeks (daughter will check blood pressure daily as she is a nurse) Follow-up in clinic in one month

## 2016-03-21 ENCOUNTER — Encounter: Payer: Self-pay | Admitting: Family Medicine

## 2016-03-25 ENCOUNTER — Ambulatory Visit: Payer: 59

## 2016-04-01 ENCOUNTER — Ambulatory Visit (INDEPENDENT_AMBULATORY_CARE_PROVIDER_SITE_OTHER): Payer: Self-pay | Admitting: Psychology

## 2016-04-01 DIAGNOSIS — F4323 Adjustment disorder with mixed anxiety and depressed mood: Secondary | ICD-10-CM

## 2016-04-01 NOTE — Patient Instructions (Addendum)
It was great seeing you today!  Remember to track your thoughts in the Thought log over the next couple of weeks.   Practice the deep breathing strategies at least once a day.   Your next appointment in behavioral health with Boneta LucksJenny on November 28th at 4pm.

## 2016-04-01 NOTE — Progress Notes (Signed)
Reason for follow-up:  Intermittent low mood, negative thoughts  Issues discussed:  [facility] information on content of negative thoughts, potential triggers for thoughts. Discussed thoughts/feelings/behavior relationship and [facility] taught deep breathing relaxation.

## 2016-04-01 NOTE — Assessment & Plan Note (Signed)
Assessment/Plan/Recommendations: Patient reports experiencing negative thoughts, such as "I'm ugly" "I'm useless" or thoughts that her husband will leave her. Patient describes this as "irrational thinking" but states that when she is having a bad day, these thoughts can come on for a couple of hours and they do not seem irrational to her at the time. Patient thinks that her age and premenopausal state may be contributing to the onset of these "irrational thoughts." Patient also said that when she has these thoughts, she feels like her blood pressure is rising and her heartrate increases, though her daughter has measured her blood pressure at home and patient states it has been normal for her. Patient was given a thought log to record antecedents and consequences of her thoughts. Given her physiological experience of feeling like her heart is beating faster and her blood pressure rising, [facility] led patient through deep breathing exercise, which patient reported was helpful for her. She plans to practice this over the next few weeks. [facility] will [facility] to follow.

## 2016-04-21 ENCOUNTER — Telehealth: Payer: Self-pay | Admitting: *Deleted

## 2016-04-21 NOTE — Telephone Encounter (Signed)
Pt calling stating that she is gaining weight from amitriptyline and BREO, and she wants to know is that a side affect from the medicine. Wants dr to call her. Raevyn Sokol Bruna PotterBlount, CMA

## 2016-04-22 ENCOUNTER — Ambulatory Visit: Payer: 59

## 2016-04-22 NOTE — Telephone Encounter (Signed)
Called patient back to discuss medications.  Patient reports that she is concerned about potential to gain weight, though she is not currently gaining weight.  She is not losing weight as she would like to.  She recently increase amitriptyline to 50mg  qhs and has some minor drowsiness in AM (this is improving).    Her speech is mildly pressured and anxious.  It is difficult to complete a full sentence, but I was attempting to discuss medication side effects.  Advised allowing time to adjust to higher dose amitriptyline.  Do not increase dose again at this time.  Migraines down to 3 per month from 3 per week.  Can have some weight gain as SE.  Can make appt to discuss other medication options for migraine ppx if desired.  Erasmo DownerAngela M Bacigalupo, MD, MPH PGY-3,  [facility] 04/22/2016 11:30 AM

## 2016-07-17 ENCOUNTER — Telehealth: Payer: Self-pay | Admitting: Family Medicine

## 2016-07-17 MED ORDER — NEBULIZER AIR TUBE/PLUGS MISC: 2 refills | Status: DC

## 2016-07-17 NOTE — Telephone Encounter (Signed)
Rx sent  Erasmo DownerAngela M Brenyn Petrey, MD, MPH PGY-3,  [facility] 07/17/2016 6:21 PM

## 2016-07-17 NOTE — Telephone Encounter (Signed)
Needs refill on nebulizer tube.  The one she has is very old. Please call it in to [facility] [place]

## 2016-07-17 NOTE — Telephone Encounter (Signed)
Will forward to PCP.  Miles Leyda L, RN  

## 2016-09-15 ENCOUNTER — Ambulatory Visit: Payer: 59 | Admitting: Family Medicine

## 2016-10-24 ENCOUNTER — Encounter: Payer: 59 | Admitting: Family Medicine

## 2016-11-12 ENCOUNTER — Other Ambulatory Visit: Payer: Self-pay | Admitting: Family Medicine

## 2016-11-18 ENCOUNTER — Encounter: Payer: 59 | Admitting: Family Medicine

## 2016-12-01 ENCOUNTER — Telehealth: Payer: Self-pay | Admitting: *Deleted

## 2016-12-01 NOTE — Telephone Encounter (Signed)
LMOVM following-up on after hours nurse call for uterine prolapse. Advised to call and make appointment.

## 2016-12-09 ENCOUNTER — Other Ambulatory Visit (HOSPITAL_COMMUNITY)
Admission: RE | Admit: 2016-12-09 | Discharge: 2016-12-09 | Disposition: A | Discharge status: Home / Self Care | Payer: 59 | Source: Ambulatory Visit | Attending: Obstetrics & Gynecology | Admitting: Obstetrics & Gynecology

## 2016-12-09 ENCOUNTER — Encounter (INDEPENDENT_AMBULATORY_CARE_PROVIDER_SITE_OTHER): Payer: Self-pay

## 2016-12-09 ENCOUNTER — Encounter: Payer: Self-pay | Admitting: Obstetrics & Gynecology

## 2016-12-09 ENCOUNTER — Ambulatory Visit (INDEPENDENT_AMBULATORY_CARE_PROVIDER_SITE_OTHER): Payer: 59 | Admitting: Obstetrics & Gynecology

## 2016-12-09 VITALS — BP 168/92 | HR 112 | Ht 65.0 in | Wt 164.5 lb

## 2016-12-09 DIAGNOSIS — Z01419 Encounter for gynecological examination (general) (routine) without abnormal findings: Secondary | ICD-10-CM | POA: Diagnosis present

## 2016-12-09 DIAGNOSIS — Z01411 Encounter for gynecological examination (general) (routine) with abnormal findings: Secondary | ICD-10-CM | POA: Diagnosis not present

## 2016-12-09 DIAGNOSIS — N813 Complete uterovaginal prolapse: Secondary | ICD-10-CM | POA: Diagnosis not present

## 2016-12-09 DIAGNOSIS — N8110 Cystocele, unspecified: Secondary | ICD-10-CM

## 2016-12-09 DIAGNOSIS — N816 Rectocele: Secondary | ICD-10-CM

## 2016-12-09 NOTE — Progress Notes (Signed)
Chief Complaint  Patient presents with  . Gynecologic Exam    "uterus has fallen"; having some vaginal spotting; clear discharge     Blood pressure (!) 168/92, pulse (!) 112, height 5\' 5"  (1.651 m), weight 164 lb 8 oz (74.6 kg).  46 y.o. [id number] No LMP recorded. Patient has had an ablation. The current method of family planning is tubal ligation.  Outpatient Encounter Medications as of 12/09/2016  Medication Sig  . albuterol (PROVENTIL HFA;VENTOLIN HFA) 108 (90 Base) MCG/ACT inhaler Inhale 1-2 puffs into the lungs every 6 (six) hours as needed for wheezing or shortness of breath.  Marland Kitchen. albuterol (PROVENTIL) (2.5 MG/3ML) 0.083% nebulizer solution Take 3 mLs (2.5 mg total) by nebulization every 6 (six) hours as needed for wheezing or shortness of breath.  Marland Kitchen. aspirin-acetaminophen-caffeine (EXCEDRIN MIGRAINE) 250-250-65 MG per tablet Take 2 tablets by mouth every 8 (eight) hours as needed for headache or migraine.   Marland Kitchen. Respiratory Therapy Supplies (NEBULIZER AIR TUBE/PLUGS) MISC Nebulizer tubing  . [DISCONTINUED] amitriptyline (ELAVIL) 25 MG tablet TAKE TWO TABLETS BY MOUTH AT BEDTIME  . [DISCONTINUED] fluticasone furoate-vilanterol (BREO ELLIPTA) 200-25 MCG/INH AEPB Inhale 1 puff into the lungs daily. (Patient taking differently: Inhale 1 puff into the lungs every evening. )  . [DISCONTINUED] ibuprofen (ADVIL,MOTRIN) 200 MG tablet Take 800 mg by mouth every 6 (six) hours as needed for moderate pain.  . [DISCONTINUED] SUMAtriptan (IMITREX) 50 MG tablet Take 1 tablet (50 mg total) by mouth every 2 (two) hours as needed for migraine. May repeat in 2 hours if headache persists or recurs. (Patient not taking: Reported on 12/18/2016)  . [DISCONTINUED] amLODipine (NORVASC) 5 MG tablet Take 1 tablet (5 mg total) by mouth daily.  . [DISCONTINUED] medroxyPROGESTERone (PROVERA) 10 MG tablet Take 1 tablet (10 mg total) by mouth daily.   No facility-administered encounter medications on file as of  12/09/2016.     Subjective Marie Webb Presents today complaining of pelvic pressure and the feeling of something is hanging out She states she has had this feeling now for the last several months but is gotten significantly worse in the last few weeks Her discomfort is in the vagina and pelvis and it feels like a pulling and pressure sensation It is moderately uncomfortable and again has been going on for a few months And makes it impossible to have intercourse because of hitting her cervix she thinks She can use a tampon Even sitting down is uncomfortable  Objective General WDWN female NAD Abdomen is soft nontender no masses no hepatosplenomegaly no guarding or rebound skin is normal Vulva:  normal appearing vulva with no masses, tenderness or lesions Vagina: At rest in the supine position the patient cervix is at the vaginal introitus and when I asked her to strain the entire uterus comes out of the vagina otherwise the vaginal mucosa is normal without lesions or discharge Cervix: Parous in appearance without lesions protruding through the vagina with minimal pressure Uterus: Protruding to the vagina with minimal pressure otherwise normal session contour Adnexa: ovaries:present,  normal adnexa in size, nontender and no masses   Pertinent ROS No burning with urination, frequency or urgency No nausea, vomiting or diarrhea Nor fever chills or other constitutional symptoms   Labs or studies No new    Impression Diagnoses this Encounter::   ICD-10-CM   1. Encounter for gynecological examination with Papanicolaou smear of cervix Z01.419 Cytology - PAP  2. POP-Q stage 2 cystocele N81.10   3.  POP-Q stage 3 rectocele N81.6   4. Complete uterine prolapse N81.3     Established relevant diagnosis(es):   Plan/Recommendations: No orders of the defined types were placed in this encounter.   Labs or Scans Ordered: No orders of the defined types were placed in this  encounter.   Management:: Discussed at length the patient's significant pelvic organ prolapse, most specifically complete uterine prolapse At this point she has a vacation scheduled in August which she wants to keep After that she will decide when she would like to have definitive surgery for her pelvic organ prolapse which will include a vaginal hysterectomy anterior colporrhaphy with vaginal vault suspension  Follow up Return in about 7 weeks (around 01/29/2017) for pre op with dr Despina Hidden.       All questions were answered.  Past Medical History:  Diagnosis Date  . Asthma   . Complication of anesthesia   . Depression   . Headache   . Hypertension   . Legally blind   . Night terrors, adult   . PONV (postoperative nausea and vomiting)     Past Surgical History:  Procedure Laterality Date  . ENDOMETRIAL ABLATION      OB History    Gravida Para Term Preterm AB Living   5 4 4   1 4    SAB TAB Ectopic Multiple Live Births   1       4      Allergies  Allergen Reactions  . Stadol [Butorphanol] Other (See Comments)    Anxiety/tremors  . Advair Diskus [Fluticasone-Salmeterol] Other (See Comments)    Headache  . Sertraline Hcl Other (See Comments)    Worsened Headaches (failed to reduce headaches)    Social History   Socioeconomic History  . Marital status: Married    Spouse name: None  . Number of children: None  . Years of education: None  . Highest education level: None  Social Needs  . Financial resource strain: None  . Food insecurity - worry: None  . Food insecurity - inability: None  . Transportation needs - medical: None  . Transportation needs - non-medical: None  Occupational History  . None  Tobacco Use  . Smoking status: Never Smoker  . Smokeless tobacco: Never Used  Substance and Sexual Activity  . Alcohol use: Yes    Comment: rare  . Drug use: No  . Sexual activity: Not Currently    Birth control/protection: Surgical    Comment: ablation   Other Topics Concern  . None  Social History Narrative  . None    Family History  Problem Relation Age of Onset  . Colon cancer Maternal Grandmother   . Colon cancer Mother   . Depression Mother   . Alcohol abuse Maternal Grandfather   . Cancer Brother   . Other Sister        killed in MVA  . Alcohol abuse Brother   . Asthma Son

## 2016-12-10 ENCOUNTER — Telehealth: Payer: Self-pay | Admitting: *Deleted

## 2016-12-10 NOTE — Telephone Encounter (Signed)
Patient called stating she has decided to cancel her trip because she wants to proceed with her surgery sooner. Wants to move up her pre-op appointment. Will reschedule.

## 2016-12-10 NOTE — Telephone Encounter (Signed)
Exercising is fine

## 2016-12-12 ENCOUNTER — Telehealth: Payer: Self-pay | Admitting: Obstetrics & Gynecology

## 2016-12-12 LAB — CYTOLOGY - PAP
DIAGNOSIS: NEGATIVE
HPV (WINDOPATH): NOT DETECTED

## 2016-12-12 NOTE — Telephone Encounter (Signed)
Patient called stating she thinks her uterus is hanging out more and doesn't know if she should keep pushing it in or go to the ER. Informed patient that per Dr Despina HiddenEure, the ER would not do anything but use a Pessary in which he has tried and it did not work. Advised to try a super tampon. Also informed of surgery date Aug 8. Verbalized understanding.

## 2016-12-18 ENCOUNTER — Encounter: Payer: Self-pay | Admitting: Internal Medicine

## 2016-12-18 ENCOUNTER — Ambulatory Visit (INDEPENDENT_AMBULATORY_CARE_PROVIDER_SITE_OTHER): Payer: 59 | Admitting: Internal Medicine

## 2016-12-18 ENCOUNTER — Encounter: Payer: Self-pay | Admitting: Obstetrics & Gynecology

## 2016-12-18 ENCOUNTER — Ambulatory Visit (INDEPENDENT_AMBULATORY_CARE_PROVIDER_SITE_OTHER): Payer: 59 | Admitting: Obstetrics & Gynecology

## 2016-12-18 VITALS — BP 164/100 | HR 92 | Wt 166.0 lb

## 2016-12-18 VITALS — BP 152/102 | HR 104 | Temp 98.5°F | Ht 65.0 in | Wt 165.0 lb

## 2016-12-18 DIAGNOSIS — I10 Essential (primary) hypertension: Secondary | ICD-10-CM

## 2016-12-18 DIAGNOSIS — N813 Complete uterovaginal prolapse: Secondary | ICD-10-CM

## 2016-12-18 DIAGNOSIS — Z0000 Encounter for general adult medical examination without abnormal findings: Secondary | ICD-10-CM | POA: Diagnosis not present

## 2016-12-18 DIAGNOSIS — N938 Other specified abnormal uterine and vaginal bleeding: Secondary | ICD-10-CM

## 2016-12-18 DIAGNOSIS — Z01818 Encounter for other preprocedural examination: Secondary | ICD-10-CM | POA: Diagnosis not present

## 2016-12-18 DIAGNOSIS — N816 Rectocele: Secondary | ICD-10-CM

## 2016-12-18 DIAGNOSIS — N8110 Cystocele, unspecified: Secondary | ICD-10-CM

## 2016-12-18 MED ORDER — SUMATRIPTAN SUCCINATE 50 MG PO TABS: 50.0000 mg | ORAL_TABLET | ORAL | 6 refills | Status: DC | PRN

## 2016-12-18 MED ORDER — AMLODIPINE BESYLATE 5 MG PO TABS: 5.0000 mg | ORAL_TABLET | Freq: Every day | ORAL | 3 refills | Status: DC

## 2016-12-18 NOTE — Patient Instructions (Addendum)
It was nice meeting you today Ms. Klabunde!  Please begin taking amlodipine 5 mg (one tablet) once a day for your blood pressure. I will see you back in 6-8 weeks to see how your blood pressure is doing.   I will let you know if there are any abnormalities with your bloodwork. If you do not hear from me, that means your bloodwork was normal.   I hope everything goes well with your upcoming procedure.   If you have any questions or concerns, please feel free to call the clinic.   Be well,  Dr. Natale MilchLancaster

## 2016-12-18 NOTE — Progress Notes (Signed)
46 y.o. year old female presents for well woman/preventative visit and annual GYN examination.  Acute Concerns: Blood pressure Monitors at home and normally gets systolics in 130s-140s. Was prescribed amlodipine 5mg  in 02/2016 by prior PCP but never started this as she continued to monitor her BP at home after it was prescribed and didn't see any high readings. Thinks that her BP might be elevated due to anxiety over upcoming surgical procedure. Is amenable to starting medication today since her BP remains elevated.   Migraines Well controlled on current regimen of amitriptyline and Imitrex. Is having much fewer than she used to. Needs refill on Imitrex.   Diet: Well-balanced. Doesn't eat breakfast, only drinks coffee in morning. Usually eats two meals a day. Eats fruits and vegetables, doesn't snack much. Weakness is pasta and carbs. Drinks a lot of water.   Exercise: Exercises on bike 45 minutes per day. Walk and hike on weekends.   Sexual/Birth History: Perimenopausal.   Birth Control: Having hysterectomy next month.   Social:  Social History   Social History  . Marital status: Married    Spouse name: N/A  . Number of children: N/A  . Years of education: N/A   Social History Main Topics  . Smoking status: Never Smoker  . Smokeless tobacco: Never Used  . Alcohol use No  . Drug use: No  . Sexual activity: Yes    Birth control/ protection: Surgical     Comment: ablation   Other Topics Concern  . None   Social History Narrative  . None    Immunization: Immunization History  Administered Date(s) Administered  . Influenza,inj,Quad PF,36+ Mos 06/27/2014, 03/23/2015, 03/18/2016  . Tdap 06/27/2014, 12/18/2015    Cancer Screening:  Pap Smear: With Dr. Despina HiddenEure last month. Normal results.   Mammogram: 2017 Oct  Colonoscopy: N/A  Dexa: N/A    Physical Exam: VITALS: Reviewed GEN: Pleasant female, NAD HEENT: Normocephalic, PERRL, EOMI, no scleral icterus, bilateral TM  pearly grey, nasal septum midline, MMM, uvula midline, no anterior or posterior lymphadenopathy, no thyromegaly CARDIAC:RRR, S1 and S2 present, no murmur, no heaves/thrills RESP: CTAB, normal effort BREAST:Exam performed in the presence of a chaperone. No masses. No nipple discharge. No axillary lymphadenopathy. ABD: soft, no tenderness, normal bowel sounds EXT: No edema, 2+ radial and DP pulses SKIN: no rash  ASSESSMENT & PLAN: 46 y.o. female presents for annual well woman/preventative exam and GYN exam. Please see problem specific assessment and plan.   Essential hypertension BP elevated in office today ~150/100. As patient with persistently elevated BPs at office visits over the past two years and reported elevated BP at home, will begin amlodipine 5mg  today. Patient agreeable to taking this.  - Begin amlodipine 5mg  qd - F/u in six weeks  Tarri AbernethyAbigail J Willine Schwalbe, MD, MPH PGY-3 [facility] Pager [phone]

## 2016-12-19 LAB — CBC
Hematocrit: 40.7 % (ref 34.0–46.6)
Hemoglobin: 13.7 g/dL (ref 11.1–15.9)
MCH: 29.7 pg (ref 26.6–33.0)
MCHC: 33.7 g/dL (ref 31.5–35.7)
MCV: 88 fL (ref 79–97)
PLATELETS: 313 10*3/uL (ref 150–379)
RBC: 4.61 x10E6/uL (ref 3.77–5.28)
RDW: 13.8 % (ref 12.3–15.4)
WBC: 7.9 10*3/uL (ref 3.4–10.8)

## 2016-12-19 LAB — BASIC METABOLIC PANEL
BUN/Creatinine Ratio: 21 (ref 9–23)
BUN: 13 mg/dL (ref 6–24)
CALCIUM: 9.1 mg/dL (ref 8.7–10.2)
CHLORIDE: 101 mmol/L (ref 96–106)
CO2: 24 mmol/L (ref 20–29)
Creatinine, Ser: 0.62 mg/dL (ref 0.57–1.00)
GFR calc non Af Amer: 107 mL/min/{1.73_m2} (ref >59)
GFR, EST AFRICAN AMERICAN: 123 mL/min/{1.73_m2} (ref >59)
GLUCOSE: 83 mg/dL (ref 65–99)
POTASSIUM: 4.2 mmol/L (ref 3.5–5.2)
Sodium: 141 mmol/L (ref 134–144)

## 2016-12-19 LAB — LIPID PANEL
CHOL/HDL RATIO: 4.1 ratio (ref 0.0–4.4)
Cholesterol, Total: 242 mg/dL — ABNORMAL HIGH (ref 100–199)
HDL: 59 mg/dL (ref >39)
LDL Calculated: 156 mg/dL — ABNORMAL HIGH (ref 0–99)
Triglycerides: 133 mg/dL (ref 0–149)
VLDL CHOLESTEROL CAL: 27 mg/dL (ref 5–40)

## 2016-12-19 NOTE — Assessment & Plan Note (Signed)
BP elevated in office today ~150/100. As patient with persistently elevated BPs at office visits over the past two years and reported elevated BP at home, will begin amlodipine 5mg  today. Patient agreeable to taking this.  - Begin amlodipine 5mg  qd - F/u in six weeks

## 2016-12-23 ENCOUNTER — Telehealth: Payer: Self-pay | Admitting: Obstetrics & Gynecology

## 2016-12-23 NOTE — Telephone Encounter (Signed)
Patient called wanting to know how long she will have to self-cath at home after her surgery. Please advise.

## 2016-12-23 NOTE — Telephone Encounter (Signed)
Patient called stating that she knows Dr. Despina HiddenEure is not in today and wont be in probably until next week. Pt Just has a general question for his nurse. Please contact pt

## 2016-12-24 NOTE — Telephone Encounter (Signed)
Her bladder will let us know, she will stop when she can effectively empty her bladder, the bladder will tell us

## 2016-12-26 ENCOUNTER — Encounter (HOSPITAL_COMMUNITY): Payer: Self-pay

## 2016-12-26 ENCOUNTER — Other Ambulatory Visit: Payer: Self-pay

## 2016-12-26 ENCOUNTER — Encounter (HOSPITAL_COMMUNITY)
Admission: RE | Admit: 2016-12-26 | Discharge: 2016-12-26 | Disposition: A | Discharge status: Home / Self Care | Payer: 59 | Source: Ambulatory Visit | Attending: Obstetrics & Gynecology | Admitting: Obstetrics & Gynecology

## 2016-12-26 ENCOUNTER — Other Ambulatory Visit: Payer: Self-pay | Admitting: Obstetrics & Gynecology

## 2016-12-26 DIAGNOSIS — Z01812 Encounter for preprocedural laboratory examination: Secondary | ICD-10-CM | POA: Insufficient documentation

## 2016-12-26 DIAGNOSIS — Z01810 Encounter for preprocedural cardiovascular examination: Secondary | ICD-10-CM | POA: Insufficient documentation

## 2016-12-26 HISTORY — DX: Adverse effect of unspecified anesthetic, initial encounter: T41.45XA

## 2016-12-26 HISTORY — DX: Essential (primary) hypertension: I10

## 2016-12-26 HISTORY — DX: Headache, unspecified: R51.9

## 2016-12-26 HISTORY — DX: Sleep terrors (night terrors): F51.4

## 2016-12-26 HISTORY — DX: Nausea with vomiting, unspecified: R11.2

## 2016-12-26 HISTORY — DX: Other complications of anesthesia, initial encounter: T88.59XA

## 2016-12-26 HISTORY — DX: Other specified postprocedural states: Z98.890

## 2016-12-26 HISTORY — DX: Legal blindness, as defined in USA: H54.8

## 2016-12-26 HISTORY — DX: Headache: R51

## 2016-12-26 LAB — COMPREHENSIVE METABOLIC PANEL WITH GFR
ALT: 14 U/L (ref 14–54)
AST: 17 U/L (ref 15–41)
Albumin: 4.3 g/dL (ref 3.5–5.0)
Alkaline Phosphatase: 85 U/L (ref 38–126)
Anion gap: 12 (ref 5–15)
BUN: 16 mg/dL (ref 6–20)
CO2: 24 mmol/L (ref 22–32)
Calcium: 9.3 mg/dL (ref 8.9–10.3)
Chloride: 100 mmol/L — ABNORMAL LOW (ref 101–111)
Creatinine, Ser: 0.65 mg/dL (ref 0.44–1.00)
GFR calc Af Amer: >60 mL/min
GFR calc non Af Amer: >60 mL/min
Glucose, Bld: 82 mg/dL (ref 65–99)
Potassium: 3.9 mmol/L (ref 3.5–5.1)
Sodium: 136 mmol/L (ref 135–145)
Total Bilirubin: 0.6 mg/dL (ref 0.3–1.2)
Total Protein: 7.9 g/dL (ref 6.5–8.1)

## 2016-12-26 LAB — CBC
HEMATOCRIT: 42.4 % (ref 36.0–46.0)
HEMOGLOBIN: 14.4 g/dL (ref 12.0–15.0)
MCH: 29.8 pg (ref 26.0–34.0)
MCHC: 34 g/dL (ref 30.0–36.0)
MCV: 87.8 fL (ref 78.0–100.0)
Platelets: 308 10*3/uL (ref 150–400)
RBC: 4.83 MIL/uL (ref 3.87–5.11)
RDW: 12.8 % (ref 11.5–15.5)
WBC: 8 10*3/uL (ref 4.0–10.5)

## 2016-12-26 LAB — URINALYSIS, ROUTINE W REFLEX MICROSCOPIC
BACTERIA UA: NONE SEEN
Bilirubin Urine: NEGATIVE
Glucose, UA: NEGATIVE mg/dL
Ketones, ur: NEGATIVE mg/dL
Leukocytes, UA: NEGATIVE
Nitrite: NEGATIVE
Protein, ur: NEGATIVE mg/dL
SPECIFIC GRAVITY, URINE: 1.016 (ref 1.005–1.030)
Squamous Epithelial / LPF: NONE SEEN
pH: 6 (ref 5.0–8.0)

## 2016-12-26 LAB — SURGICAL PCR SCREEN
MRSA, PCR: NEGATIVE
Staphylococcus aureus: NEGATIVE

## 2016-12-26 LAB — RAPID HIV SCREEN (HIV 1/2 AB+AG)
HIV 1/2 ANTIBODIES: NONREACTIVE
HIV-1 P24 ANTIGEN - HIV24: NONREACTIVE

## 2016-12-26 LAB — HCG, QUANTITATIVE, PREGNANCY: hCG, Beta Chain, Quant, S: <1 m[IU]/mL (ref <5)

## 2016-12-26 NOTE — Patient Instructions (Signed)
Marie Webb  12/26/2016     @PREFPERIOPPHARMACY @   Your procedure is scheduled on  12/31/2016   [facility] at  700  A.M.  Call this number if you have problems the morning of surgery:  [phone]   Remember:  Do not eat food or drink liquids after midnight.  Take these medicines the morning of surgery with A SIP OF WATER   None   Do not wear jewelry, make-up or nail polish.  Do not wear lotions, powders, or perfumes, or deoderant.  Do not shave 48 hours prior to surgery.  Men may shave face and neck.  Do not bring valuables to the hospital.  [facility] not [facility] any belongings or valuables.  Contacts, dentures or bridgework may not be worn into surgery.  Leave your suitcase in the car.  After surgery it may be brought to your room.  For patients admitted to the hospital, discharge time will be determined by your treatment team.  Patients discharged the day of surgery will not be allowed to drive home.   Name and phone number of your driver:   family Special instructions:  None  Please read over the following fact sheets that you were given. Pain Booklet, Coughing and Deep Breathing, Lab Information, MRSA Information, Surgical Site Infection Prevention, Anesthesia Post-op Instructions and Care and Recovery After Surgery       Vaginal Hysterectomy A vaginal hysterectomy is a procedure to remove all or part of the uterus through a small incision in the vagina. In this procedure, your health care provider may remove your entire uterus, including the lower end (cervix). You may need a vaginal hysterectomy to treat:  Uterine fibroids.  A condition that causes the lining of the uterus to grow in other areas (endometriosis).  Problems with pelvic support.  Cancer of the cervix, ovaries, uterus, or tissue that lines the uterus (endometrium).  Excessive (dysfunctional) uterine bleeding.  When removing your uterus,  your health care provider may also remove the organs that produce eggs (ovaries) and the tubes that carry eggs to your uterus (fallopian tubes). After a vaginal hysterectomy, you will no longer be able to have a baby. You will also no longer get your menstrual period. Tell a health care provider about:  Any allergies you have.  All medicines you are taking, including vitamins, herbs, eye drops, creams, and over-the-counter medicines.  Any problems you or family members have had with anesthetic medicines.  Any blood disorders you have.  Any surgeries you have had.  Any medical conditions you have.  Whether you are pregnant or may be pregnant. What are the risks? Generally, this is a safe procedure. However, problems may occur, including:  Bleeding.  Infection.  A blood clot that forms in your leg and travels to your lungs (pulmonary embolism).  Damage to surrounding organs.  Pain during sex.  What happens before the procedure?  Ask your health care provider what organs will be removed during surgery.  Ask your health care provider about: ? Changing or stopping your regular medicines. This is especially important if you are taking diabetes medicines or blood thinners. ? Taking medicines such as aspirin and ibuprofen. These medicines can thin your blood. Do not take these medicines before your procedure if your health care provider instructs you not to.  Follow instructions from your health care provider about eating or  drinking restrictions.  Do not use any tobacco products, such as cigarettes, chewing tobacco, and e-cigarettes. If you need help quitting, ask your health care provider.  Plan to have someone take you home after discharge from the hospital. What happens during the procedure?  To reduce your risk of infection: ? Your health care team will wash or sanitize their hands. ? Your skin will be washed with soap.  An IV tube will be inserted into one of your  veins.  You may be given antibiotic medicine to help prevent infection.  You will be given one or more of the following: ? A medicine to help you relax (sedative). ? A medicine to numb the area (local anesthetic). ? A medicine to make you fall asleep (general anesthetic). ? A medicine that is injected into an area of your body to numb everything beyond the injection site (regional anesthetic).  Your surgeon will make an incision in your vagina.  Your surgeon will locate and remove all or part of your uterus.  Your ovaries and fallopian tubes may be removed at the same time.  The incision will be closed with stitches (sutures) that dissolve over time. The procedure may vary among health care providers and hospitals. What happens after the procedure?  Your blood pressure, heart rate, breathing rate, and blood oxygen level will be monitored often until the medicines you were given have worn off.  You will be encouraged to get up and walk around after a few hours to help prevent complications.  You may have IV tubes in place for a few days.  You will be given pain medicine as needed.  Do not drive for 24 hours if you were given a sedative. This information is not intended to replace advice given to you by your health care provider. Make sure you discuss any questions you have with your health care provider. Document Released: 09/03/2015 Document Revised: 10/18/2015 Document Reviewed: 05/27/2015 Elsevier Interactive Patient Education  2018 [employer]  Vaginal Hysterectomy, Care After Refer to this sheet in the next few weeks. These instructions provide you with information about caring for yourself after your procedure. Your health care provider may also give you more specific instructions. Your treatment has been planned according to current medical practices, but problems sometimes occur. Call your health care provider if you have any problems or questions after your  procedure. What can I expect after the procedure? After the procedure, it is common to have:  Pain.  Soreness and numbness in your incision areas.  Vaginal bleeding and discharge.  Constipation.  Temporary problems emptying the bladder.  Feelings of sadness or other emotions.  Follow these instructions at home: Medicines  Take over-the-counter and prescription medicines only as told by your health care provider.  If you were prescribed an antibiotic medicine, take it as told by your health care provider. Do not stop taking the antibiotic even if you start to feel better.  Do not drive or operate heavy machinery while taking prescription pain medicine. Activity  Return to your normal activities as told by your health care provider. Ask your health care provider what activities are safe for you.  Get regular exercise as told by your health care provider. You may be told to take short walks every day and go farther each time.  Do not lift anything that is heavier than 10 lb (4.5 kg). General instructions   Do not put anything in your vagina for 6 weeks after your surgery or  as told by your health care provider. This includes tampons and douches.  Do not have sex until your health care provider says you can.  Do not take baths, swim, or use a hot tub until your health care provider approves.  Drink enough fluid to keep your urine clear or pale yellow.  Do not drive for 24 hours if you were given a sedative.  Keep all follow-up visits as told by your health care provider. This is important. Contact a health care provider if:  Your pain medicine is not helping.  You have a fever.  You have redness, swelling, or pain at your incision site.  You have blood, pus, or a bad-smelling discharge from your vagina.  You continue to have difficulty urinating. Get help right away if:  You have severe abdominal or back pain.  You have heavy bleeding from your vagina.  You  have chest pain or shortness of breath. This information is not intended to replace advice given to you by your health care provider. Make sure you discuss any questions you have with your health care provider. Document Released: 09/03/2015 Document Revised: 10/18/2015 Document Reviewed: 05/27/2015 Elsevier Interactive Patient Education  2018 [employer] Inc.  Anterior and Posterior Colporrhaphy Anterior or posterior colporrhaphy is surgery to fix a prolapse of organs in the genital tract. Prolapse means the falling down, bulging, dropping, or drooping of an organ. Organs that commonly prolapse include the rectum, bladder, vagina, and uterus. Prolapse can affect a single organ or several organs at the same time. This often worsens when women stop having their monthly periods (menopause) because estrogen loss weakens the muscles and tissues in the genital tract. In addition, prolapse happens when the organs are damaged or weakened. This commonly happens after childbirth and as a result of aging. Surgery is often done for severe prolapses. The type of colporrhaphy done depends on the type of genital prolapse. Types of genital prolapse include the following:  Cystocele. This is a prolapse of the upper (anterior) wall of the vagina. The anterior wall bulges into the vagina and brings the bladder with it.  Rectocele. This is a prolapse of the lower (posterior) wall of the vagina. The posterior vaginal wall bulges into the vagina and brings the rectum with it.  Enterocele. This is a prolapse of part of the pelvic organs called the pouch of Douglas. It also involves a portion of the small bowel. It appears as a bulge under the neck of the uterus at the top of the back wall of the vagina.  Procidentia. This is a complete prolapse of the uterus and the cervix. The prolapse can be seen and felt coming out of the vagina.  LET [facility] CARE PROVIDER KNOW ABOUT:  Any allergies you have.  All medicines you  are taking, including vitamins, herbs, eye drops, creams, and over-the-counter medicines.  Previous problems you or members of your family have had with the use of anesthetics.  Any blood disorders you have.  Previous surgeries you have had.  Medical conditions you have.  Smoking history or history of alcohol use.  Possibility of pregnancy, if this applies. RISKS AND COMPLICATIONS Generally, anterior or posterior colporrhaphy is a safe procedure. However, as with any procedure, complications can occur. Possible complications include:  Infection.  Damage to other organs during surgery.  Bleeding after surgery.  Problems urinating.  Problems from the anesthetic.  BEFORE THE PROCEDURE  Ask your health care provider about changing or stopping your regular medicines.  Do not eat or drink anything for at least 8 hours before the surgery.  If you smoke, do not smoke for at least 2 weeks before the surgery.  Make plans to have someone drive you home after your hospital stay. Also, arrange for someone to help you with activities during recovery. PROCEDURE You may be given medicine to help you relax before the surgery (sedative). During the surgery you will be given medicine to make you sleep through the procedure (general anesthetic) or medicine to numb you from the waist down (spinal anesthetic). This medicine will be given through an intravenous (IV) access tube that is put into one of your veins. The procedure will vary depending on the type of repair:  Anterior repair. A cut (incision) is made in the midline section of the front part of the vaginal wall. A triangular-shaped piece of vaginal tissue is removed, and the stronger, healthier tissue is sewn together in order to support and suspend the bladder.  Posterior repair. An incision is made midline on the back wall of the vagina. A triangular portion of vaginal skin is removed to expose the muscle. Excess tissue is removed, and  stronger, healthier muscle and ligament tissue is sewn together to support the rectum.  Anterior and posterior repair. Both procedures are done during the same surgery.  What to expect after the procedure You will be taken to a recovery area. Your blood pressure, pulse, breathing, and temperature (vital signs) will be monitored. This is done until you are stable. Then you will be transferred to a hospital room. After surgery, you will have a small rubber tube in place to drain your bladder (urinary catheter). This will be in place for 2 to 7 days or until your bladder is working properly on its own. The IV access tube will be removed in 1 to 3 days. You may have a gauze packing in your vagina to prevent bleeding. This will be removed 2 or 3 days after the surgery. You will likely need to stay in the hospital for 3 to 5 days. This information is not intended to replace advice given to you by your health care provider. Make sure you discuss any questions you have with your health care provider. Document Released: 08/02/2003 Document Revised: 10/18/2015 Document Reviewed: 10/01/2012 Elsevier Interactive Patient Education  2017 [employer] Inc. Anterior and Posterior Colporrhaphy, Care After Refer to this sheet in the next few weeks. These instructions provide you with information on caring for yourself after your procedure. Your health care provider may also give you more specific instructions. Your treatment has been planned according to current medical practices, but problems sometimes occur. Call your health care provider if you have any problems or questions after your procedure. Follow these instructions at home:  Take frequent rest periods throughout the day.  Only take over-the-counter or prescription medicines as directed by your health care provider.  Avoid strenuous activity such as heavy lifting (more than 10 pounds [4.5 kg]), pushing, and pulling until your health care provider says it is  okay.  Take showers if your health care provider approves. Pat incisions dry. Do not rub incisions with a washcloth or towel. Do not take tub baths until your health care provider approves.  Wear compression stockings as directed by your health care provider. These stockings help prevent blood clots from forming in your legs.  Talk with your health care provider about when you may return to work and your exercise routine.  Do not drive  until your health care provider approves.  You may resume your normal diet. Eat a well-balanced diet.  Drink enough fluids to keep your urine clear or pale yellow.  Your normal bowel function should return. If you become constipated, you may: ? Take a mild laxative. ? Add fruit and bran to your diet. ? Drink more fluids.  Do not have sexual intercourse until permitted by your health care provider.  Follow up with your health care provider as directed. Contact a health care provider if: You have persistent nausea or vomiting. Get help right away if:  You have increased bleeding (more than a small spot) from the vaginal area.  Your pain is not relieved with medicine or becomes worse.  You have redness, swelling, or increasing pain in the vaginal area.  You have abdominal pain.  You see pus coming from the wounds.  You develop a fever.  You have a foul smell coming from your vaginal area.  You develop light-headedness or you feel faint.  You have difficulty breathing. This information is not intended to replace advice given to you by your health care provider. Make sure you discuss any questions you have with your health care provider. Document Released: 11/28/2004 Document Revised: 10/18/2015 Document Reviewed: 10/01/2012 Elsevier Interactive Patient Education  2017 Else[employer] Inc.  Bilateral Salpingo-Oophorectomy Bilateral salpingo-oophorectomy is the surgical removal of both fallopian tubes and both ovaries. The ovaries are reproductive  organs that produce eggs in women. The fallopian tubes allow eggs to move from the ovaries to the uterus. You may need this procedure if you:  Have had your uterus removed. This procedure is usually done after the uterus is removed.  Have cancer of the fallopian tubes or ovaries.  Have a high risk of cancer of the fallopian tubes or ovaries.  There are three different techniques that can be used for this procedure:  Open. One large incision will be made in your abdomen.  Laparoscopic. A thin, lighted tube with a small camera on the end (laparoscope) will be used to help perform the procedure. The laparoscope will allow your surgeon to make several small incisions in the abdomen instead of one large incision.  Robot-assisted. A computer will be used to control surgical instruments that are attached to robotic arms. A laparoscope may also be used with this technique.  As a result of this procedure, you will become sterile (unable to become pregnant), and you will go into menopause (no longer able to have menstrual periods). You may develop symptoms of menopause such as hot flashes, night sweats, and mood changes. Your sex drive may also be affected. Tell a health care provider about:  Any allergies you have.  All medicines you are taking, including vitamins, herbs, eye drops, creams, and over-the-counter medicines.  Any problems you or family members have had with anesthetic medicines.  Any blood disorders you have.  Any surgeries you have had.  Any medical conditions you have.  Whether you are pregnant or may be pregnant. What are the risks? Generally, this is a safe procedure. However, problems may occur, including:  Infection.  Bleeding.  Allergic reactions to medicines.  Damage to other structures or organs.  Blood clots in the legs or lungs.  What happens before the procedure? Staying hydrated Follow instructions from your health care provider about hydration, which  may include:  Up to 2 hours before the procedure - you may continue to drink clear liquids, such as water, clear fruit juice, black coffee, and  plain tea.  Eating and drinking restrictions Follow instructions from your health care provider about eating and drinking, which may include:  8 hours before the procedure - stop eating heavy meals or foods such as meat, fried foods, or fatty foods.  6 hours before the procedure - stop eating light meals or foods, such as toast or cereal.  6 hours before the procedure - stop drinking milk or drinks that contain milk.  2 hours before the procedure - stop drinking clear liquids.  Medicines  Ask your health care provider about: ? Changing or stopping your regular medicines. This is especially important if you are taking diabetes medicines or blood thinners. ? Taking medicines such as aspirin and ibuprofen. These medicines can thin your blood. Do not take these medicines before your procedure if your health care provider instructs you not to.  You may be given antibiotic medicine to help prevent infection. General instructions  Do not smoke for at least 2 weeks before your procedure or as told by your health care provider.  You may have an exam or testing.  You may have a blood or urine sample taken.  Ask your health care provider how your surgical site will be marked or identified.  Plan to have someone take you home from the hospital.  If you will be going home right after the procedure, plan to have someone with you for 24 hours. What happens during the procedure?  To reduce your risk of infection: ? Your health care team will wash or sanitize their hands. ? Your skin will be washed with soap. ? Hair may be removed from the surgical area.  An IV tube will be inserted into one of your veins.  You will be given one or more of the following: ? A medicine to help you relax (sedative). ? A medicine to make you fall asleep (general  anesthetic).  A thin tube (catheter) will be inserted through your urethra and into your bladder. The catheter drains urine during your procedure.  Depending on the type of surgery you are having, your surgeon will do one of the following: ? Make one incision in your abdomen (open surgery). ? Make two small incisions in your abdomen (laparoscopic surgery). The laparoscope will be passed through one incision, and surgical instruments will be passed through the other. ? Make several small incisions in your abdomen (robot-assisted surgery). A laparoscope and other surgical instruments may be passed through the incisions.  Your fallopian tubes and ovaries will be cut away from the uterus and removed.  Your blood vessels will be clamped and tied to prevent too much bleeding.  The incision(s) in your abdomen will be closed with stitches (sutures) or staples.  A bandage (dressing) may be placed over your incision(s). The procedure may vary among health care providers and hospitals. What happens after the procedure?  Your blood pressure, heart rate, breathing rate, and blood oxygen level will be monitored until the medicines you were given have worn off.  You may continue to receive fluids and medicines through an IV tube.  You may continue to have a catheter draining your urine.  You may have to wear compression stockings. These stockings help to prevent blood clots and reduce swelling in your legs.  You will be given pain medicine as needed.  Do not drive for 24 hours if you received a sedative. Summary  Bilateral salpingo-oophorectomy is a procedure to remove both fallopian tubes and both ovaries.  There are three  different techniques that can be used for this procedure, including open, laparoscopic, and robotic. Talk with your health care provider about how your procedure will be done.  As a result of this procedure, you will become sterile and you will go into menopause.  Plan to  have someone take you home from the hospital. This information is not intended to replace advice given to you by your health care provider. Make sure you discuss any questions you have with your health care provider. Document Released: 05/12/2005 Document Revised: 06/16/2016 Document Reviewed: 06/16/2016 Elsevier Interactive Patient Education  2018 [employer]  General Anesthesia, Adult General anesthesia is the use of medicines to make a person "go to sleep" (be unconscious) for a medical procedure. General anesthesia is often recommended when a procedure:  Is long.  Requires you to be still or in an unusual position.  Is major and can cause you to lose blood.  Is impossible to do without general anesthesia.  The medicines used for general anesthesia are called general anesthetics. In addition to making you sleep, the medicines:  Prevent pain.  Control your blood pressure.  Relax your muscles.  Tell a health care provider about:  Any allergies you have.  All medicines you are taking, including vitamins, herbs, eye drops, creams, and over-the-counter medicines.  Any problems you or family members have had with anesthetic medicines.  Types of anesthetics you have had in the past.  Any bleeding disorders you have.  Any surgeries you have had.  Any medical conditions you have.  Any history of heart or lung conditions, such as heart failure, sleep apnea, or chronic obstructive pulmonary disease (COPD).  Whether you are pregnant or may be pregnant.  Whether you use tobacco, alcohol, marijuana, or street drugs.  Any history of Financial plannermilitary service.  Any history of depression or anxiety. What are the risks? Generally, this is a safe procedure. However, problems may occur, including:  Allergic reaction to anesthetics.  Lung and heart problems.  Inhaling food or liquids from your stomach into your lungs (aspiration).  Injury to nerves.  Waking up during your  procedure and being unable to move (rare).  Extreme agitation or a state of mental confusion (delirium) when you wake up from the anesthetic.  Air in the bloodstream, which can lead to stroke.  These problems are more likely to develop if you are having a major surgery or if you have an advanced medical condition. You can prevent some of these complications by answering all of your health care provider's questions thoroughly and by following all pre-procedure instructions. General anesthesia can cause side effects, including:  Nausea or vomiting  A sore throat from the breathing tube.  Feeling cold or shivery.  Feeling tired, washed out, or achy.  Sleepiness or drowsiness.  Confusion or agitation.  What happens before the procedure? Staying hydrated Follow instructions from your health care provider about hydration, which may include:  Up to 2 hours before the procedure - you may continue to drink clear liquids, such as water, clear fruit juice, black coffee, and plain tea.  Eating and drinking restrictions Follow instructions from your health care provider about eating and drinking, which may include:  8 hours before the procedure - stop eating heavy meals or foods such as meat, fried foods, or fatty foods.  6 hours before the procedure - stop eating light meals or foods, such as toast or cereal.  6 hours before the procedure - stop drinking milk or drinks that  contain milk.  2 hours before the procedure - stop drinking clear liquids.  Medicines  Ask your health care provider about: ? Changing or stopping your regular medicines. This is especially important if you are taking diabetes medicines or blood thinners. ? Taking medicines such as aspirin and ibuprofen. These medicines can thin your blood. Do not take these medicines before your procedure if your health care provider instructs you not to. ? Taking new dietary supplements or medicines. Do not take these during the  week before your procedure unless your health care provider approves them.  If you are told to take a medicine or to continue taking a medicine on the day of the procedure, take the medicine with sips of water. General instructions   Ask if you will be going home the same day, the following day, or after a longer hospital stay. ? Plan to have someone take you home. ? Plan to have someone stay with you for the first 24 hours after you leave the hospital or clinic.  For 3-6 weeks before the procedure, try not to use any tobacco products, such as cigarettes, chewing tobacco, and e-cigarettes.  You may brush your teeth on the morning of the procedure, but make sure to spit out the toothpaste. What happens during the procedure?  You will be given anesthetics through a mask and through an IV tube in one of your veins.  You may receive medicine to help you relax (sedative).  As soon as you are asleep, a breathing tube may be used to help you breathe.  An anesthesia specialist will stay with you throughout the procedure. He or she will help keep you comfortable and safe by continuing to give you medicines and adjusting the amount of medicine that you get. He or she will also watch your blood pressure, pulse, and oxygen levels to make sure that the anesthetics do not cause any problems.  If a breathing tube was used to help you breathe, it will be removed before you wake up. The procedure may vary among health care providers and hospitals. What happens after the procedure?  You will wake up, often slowly, after the procedure is complete, usually in a recovery area.  Your blood pressure, heart rate, breathing rate, and blood oxygen level will be monitored until the medicines you were given have worn off.  You may be given medicine to help you calm down if you feel anxious or agitated.  If you will be going home the same day, your health care provider may check to make sure you can stand,  drink, and urinate.  Your health care providers will treat your pain and side effects before you go home.  Do not drive for 24 hours if you received a sedative.  You may: ? Feel nauseous and vomit. ? Have a sore throat. ? Have mental slowness. ? Feel cold or shivery. ? Feel sleepy. ? Feel tired. ? Feel sore or achy, even in parts of your body where you did not have surgery. This information is not intended to replace advice given to you by your health care provider. Make sure you discuss any questions you have with your health care provider. Document Released: 08/19/2007 Document Revised: 10/23/2015 Document Reviewed: 04/26/2015 Elsevier Interactive Patient Education  2018 [employer]. General Anesthesia, Adult, Care After These instructions provide you with information about caring for yourself after your procedure. Your health care provider may also give you more specific instructions. Your treatment has been planned  according to current medical practices, but problems sometimes occur. Call your health care provider if you have any problems or questions after your procedure. What can I expect after the procedure? After the procedure, it is common to have:  Vomiting.  A sore throat.  Mental slowness.  It is common to feel:  Nauseous.  Cold or shivery.  Sleepy.  Tired.  Sore or achy, even in parts of your body where you did not have surgery.  Follow these instructions at home: For at least 24 hours after the procedure:  Do not: ? Participate in activities where you could fall or become injured. ? Drive. ? Use heavy machinery. ? Drink alcohol. ? Take sleeping pills or medicines that cause drowsiness. ? Make important decisions or sign legal documents. ? Take care of children on your own.  Rest. Eating and drinking  If you vomit, drink water, juice, or soup when you can drink without vomiting.  Drink enough fluid to keep your urine clear or pale  yellow.  Make sure you have little or no nausea before eating solid foods.  Follow the diet recommended by your health care provider. General instructions  Have a responsible adult stay with you until you are awake and alert.  Return to your normal activities as told by your health care provider. Ask your health care provider what activities are safe for you.  Take over-the-counter and prescription medicines only as told by your health care provider.  If you smoke, do not smoke without supervision.  Keep all follow-up visits as told by your health care provider. This is important. Contact a health care provider if:  You continue to have nausea or vomiting at home, and medicines are not helpful.  You cannot drink fluids or start eating again.  You cannot urinate after 8-12 hours.  You develop a skin rash.  You have fever.  You have increasing redness at the site of your procedure. Get help right away if:  You have difficulty breathing.  You have chest pain.  You have unexpected bleeding.  You feel that you are having a life-threatening or urgent problem. This information is not intended to replace advice given to you by your health care provider. Make sure you discuss any questions you have with your health care provider. Document Released: 08/18/2000 Document Revised: 10/15/2015 Document Reviewed: 04/26/2015 Elsevier Interactive Patient Education  [employer].

## 2016-12-30 NOTE — Telephone Encounter (Signed)
Informed patient message from Dr Despina HiddenEure:  "Her bladder will let us know, she will stop when she can effectively empty her bladder, the bladder will tell us". Verbalized understanding. States she is nervous about self cath, but daughter is a Engineer, civil (consulting)nurse.

## 2016-12-31 ENCOUNTER — Observation Stay (HOSPITAL_COMMUNITY)
Admission: RE | Admit: 2016-12-31 | Discharge: 2017-01-01 | Disposition: A | Discharge status: Home / Self Care | Payer: 59 | Source: Ambulatory Visit | Attending: Obstetrics & Gynecology | Admitting: Obstetrics & Gynecology

## 2016-12-31 ENCOUNTER — Encounter (HOSPITAL_COMMUNITY): Payer: Self-pay | Admitting: *Deleted

## 2016-12-31 ENCOUNTER — Encounter (HOSPITAL_COMMUNITY)
Admission: RE | Disposition: A | Discharge status: Home / Self Care | Payer: Self-pay | Source: Ambulatory Visit | Attending: Obstetrics & Gynecology

## 2016-12-31 ENCOUNTER — Inpatient Hospital Stay (HOSPITAL_COMMUNITY): Payer: 59 | Admitting: Anesthesiology

## 2016-12-31 DIAGNOSIS — N8189 Other female genital prolapse: Secondary | ICD-10-CM | POA: Diagnosis not present

## 2016-12-31 DIAGNOSIS — Z800 Family history of malignant neoplasm of digestive organs: Secondary | ICD-10-CM | POA: Insufficient documentation

## 2016-12-31 DIAGNOSIS — G479 Sleep disorder, unspecified: Secondary | ICD-10-CM | POA: Insufficient documentation

## 2016-12-31 DIAGNOSIS — Z809 Family history of malignant neoplasm, unspecified: Secondary | ICD-10-CM | POA: Diagnosis not present

## 2016-12-31 DIAGNOSIS — N813 Complete uterovaginal prolapse: Secondary | ICD-10-CM

## 2016-12-31 DIAGNOSIS — R51 Headache: Secondary | ICD-10-CM | POA: Insufficient documentation

## 2016-12-31 DIAGNOSIS — F418 Other specified anxiety disorders: Secondary | ICD-10-CM | POA: Diagnosis not present

## 2016-12-31 DIAGNOSIS — Z7982 Long term (current) use of aspirin: Secondary | ICD-10-CM | POA: Insufficient documentation

## 2016-12-31 DIAGNOSIS — J45909 Unspecified asthma, uncomplicated: Secondary | ICD-10-CM | POA: Insufficient documentation

## 2016-12-31 DIAGNOSIS — Z90710 Acquired absence of both cervix and uterus: Secondary | ICD-10-CM | POA: Diagnosis present

## 2016-12-31 DIAGNOSIS — Z888 Allergy status to other drugs, medicaments and biological substances status: Secondary | ICD-10-CM | POA: Insufficient documentation

## 2016-12-31 DIAGNOSIS — H548 Legal blindness, as defined in USA: Secondary | ICD-10-CM | POA: Diagnosis not present

## 2016-12-31 DIAGNOSIS — Z818 Family history of other mental and behavioral disorders: Secondary | ICD-10-CM | POA: Insufficient documentation

## 2016-12-31 DIAGNOSIS — F514 Sleep terrors [night terrors]: Secondary | ICD-10-CM | POA: Diagnosis not present

## 2016-12-31 DIAGNOSIS — F329 Major depressive disorder, single episode, unspecified: Secondary | ICD-10-CM | POA: Insufficient documentation

## 2016-12-31 DIAGNOSIS — Z811 Family history of alcohol abuse and dependence: Secondary | ICD-10-CM | POA: Diagnosis not present

## 2016-12-31 DIAGNOSIS — N72 Inflammatory disease of cervix uteri: Secondary | ICD-10-CM | POA: Diagnosis not present

## 2016-12-31 DIAGNOSIS — Z825 Family history of asthma and other chronic lower respiratory diseases: Secondary | ICD-10-CM | POA: Diagnosis not present

## 2016-12-31 DIAGNOSIS — I10 Essential (primary) hypertension: Secondary | ICD-10-CM | POA: Diagnosis not present

## 2016-12-31 DIAGNOSIS — N814 Uterovaginal prolapse, unspecified: Secondary | ICD-10-CM

## 2016-12-31 HISTORY — PX: VAGINAL HYSTERECTOMY: SHX2639

## 2016-12-31 SURGERY — HYSTERECTOMY, VAGINAL
Anesthesia: General | Site: Vagina

## 2016-12-31 MED ORDER — EPHEDRINE SULFATE 50 MG/ML IJ SOLN
INTRAMUSCULAR | Status: AC
  Filled 2016-12-31: qty 1

## 2016-12-31 MED ORDER — SODIUM CHLORIDE 0.9 % IR SOLN
Status: DC | PRN
  Administered 2016-12-31: 3000 mL
  Administered 2016-12-31: 1000 mL

## 2016-12-31 MED ORDER — KETOROLAC TROMETHAMINE 30 MG/ML IJ SOLN
30.0000 mg | Freq: Once | INTRAMUSCULAR | Status: AC
  Administered 2016-12-31: 30 mg via INTRAVENOUS

## 2016-12-31 MED ORDER — FLUTICASONE FUROATE-VILANTEROL 200-25 MCG/INH IN AEPB
1.0000 | INHALATION_SPRAY | Freq: Every day | RESPIRATORY_TRACT | Status: DC
  Administered 2017-01-01: 10:00:00 1 via RESPIRATORY_TRACT
  Filled 2016-12-31: qty 28

## 2016-12-31 MED ORDER — AMITRIPTYLINE HCL 25 MG PO TABS
50.0000 mg | ORAL_TABLET | Freq: Every day | ORAL | Status: DC
  Administered 2016-12-31: 50 mg via ORAL
  Filled 2016-12-31: qty 2

## 2016-12-31 MED ORDER — ARTIFICIAL TEARS OPHTHALMIC OINT
TOPICAL_OINTMENT | OPHTHALMIC | Status: AC
  Filled 2016-12-31: qty 3.5

## 2016-12-31 MED ORDER — HYDROMORPHONE HCL 1 MG/ML IJ SOLN
0.2500 mg | INTRAMUSCULAR | Status: DC | PRN
  Administered 2016-12-31 (×4): 0.5 mg via INTRAVENOUS
  Filled 2016-12-31 (×2): qty 1

## 2016-12-31 MED ORDER — ALUM & MAG HYDROXIDE-SIMETH 200-200-20 MG/5ML PO SUSP: 30.0000 mL | ORAL | Status: DC | PRN

## 2016-12-31 MED ORDER — ZOLPIDEM TARTRATE 5 MG PO TABS: 5.0000 mg | ORAL_TABLET | Freq: Every evening | ORAL | Status: DC | PRN

## 2016-12-31 MED ORDER — LACTATED RINGERS IV SOLN
INTRAVENOUS | Status: DC
  Administered 2016-12-31 (×3): via INTRAVENOUS

## 2016-12-31 MED ORDER — BUPIVACAINE-EPINEPHRINE (PF) 0.5% -1:200000 IJ SOLN
INTRAMUSCULAR | Status: AC
  Filled 2016-12-31: qty 30

## 2016-12-31 MED ORDER — ROCURONIUM 10MG/ML (10ML) SYRINGE FOR MEDFUSION PUMP - OPTIME
INTRAVENOUS | Status: DC | PRN
Start: 2016-12-31 — End: 2016-12-31
  Administered 2016-12-31: 5 mg via INTRAVENOUS
  Administered 2016-12-31: 10 mg via INTRAVENOUS
  Administered 2016-12-31: 40 mg via INTRAVENOUS
  Administered 2016-12-31: 5 mg via INTRAVENOUS
  Administered 2016-12-31: 10 mg via INTRAVENOUS

## 2016-12-31 MED ORDER — FENTANYL CITRATE (PF) 100 MCG/2ML IJ SOLN
INTRAMUSCULAR | Status: DC | PRN
  Administered 2016-12-31 (×8): 50 ug via INTRAVENOUS

## 2016-12-31 MED ORDER — MIDAZOLAM HCL 2 MG/2ML IJ SOLN
1.0000 mg | INTRAMUSCULAR | Status: AC
  Administered 2016-12-31 (×2): 2 mg via INTRAVENOUS
  Filled 2016-12-31: qty 2

## 2016-12-31 MED ORDER — LIDOCAINE HCL (PF) 1 % IJ SOLN
INTRAMUSCULAR | Status: AC
  Filled 2016-12-31: qty 5

## 2016-12-31 MED ORDER — ROCURONIUM BROMIDE 50 MG/5ML IV SOLN
INTRAVENOUS | Status: AC
  Filled 2016-12-31: qty 1

## 2016-12-31 MED ORDER — CEFAZOLIN SODIUM-DEXTROSE 2-4 GM/100ML-% IV SOLN
2.0000 g | INTRAVENOUS | Status: AC
  Administered 2016-12-31: 2 g via INTRAVENOUS

## 2016-12-31 MED ORDER — KETOROLAC TROMETHAMINE 30 MG/ML IJ SOLN
INTRAMUSCULAR | Status: AC
Start: 2016-12-31 — End: ?
  Filled 2016-12-31: qty 1

## 2016-12-31 MED ORDER — DEXAMETHASONE SODIUM PHOSPHATE 4 MG/ML IJ SOLN
4.0000 mg | Freq: Once | INTRAMUSCULAR | Status: AC
  Administered 2016-12-31: 4 mg via INTRAVENOUS

## 2016-12-31 MED ORDER — LIDOCAINE HCL (CARDIAC) 10 MG/ML IV SOLN
INTRAVENOUS | Status: DC | PRN
Start: 2016-12-31 — End: 2016-12-31
  Administered 2016-12-31: 40 mg via INTRAVENOUS

## 2016-12-31 MED ORDER — GLYCOPYRROLATE 0.2 MG/ML IJ SOLN
INTRAMUSCULAR | Status: DC | PRN
  Administered 2016-12-31: .6 mg via INTRAVENOUS

## 2016-12-31 MED ORDER — ASPIRIN-ACETAMINOPHEN-CAFFEINE 250-250-65 MG PO TABS: 2.0000 | ORAL_TABLET | Freq: Three times a day (TID) | ORAL | Status: DC | PRN

## 2016-12-31 MED ORDER — NEOSTIGMINE METHYLSULFATE 10 MG/10ML IV SOLN
INTRAVENOUS | Status: AC
  Filled 2016-12-31: qty 1

## 2016-12-31 MED ORDER — PROPOFOL 10 MG/ML IV BOLUS
INTRAVENOUS | Status: DC | PRN
  Administered 2016-12-31: 150 mg via INTRAVENOUS

## 2016-12-31 MED ORDER — SUCCINYLCHOLINE CHLORIDE 20 MG/ML IJ SOLN
INTRAMUSCULAR | Status: AC
  Filled 2016-12-31: qty 1

## 2016-12-31 MED ORDER — GLYCOPYRROLATE 0.2 MG/ML IJ SOLN
INTRAMUSCULAR | Status: AC
  Filled 2016-12-31: qty 3

## 2016-12-31 MED ORDER — PROPOFOL 10 MG/ML IV BOLUS
INTRAVENOUS | Status: AC
  Filled 2016-12-31: qty 40

## 2016-12-31 MED ORDER — FENTANYL CITRATE (PF) 250 MCG/5ML IJ SOLN
INTRAMUSCULAR | Status: AC
  Filled 2016-12-31: qty 5

## 2016-12-31 MED ORDER — SENNOSIDES-DOCUSATE SODIUM 8.6-50 MG PO TABS: 1.0000 | ORAL_TABLET | Freq: Every evening | ORAL | Status: DC | PRN

## 2016-12-31 MED ORDER — SCOPOLAMINE 1 MG/3DAYS TD PT72
MEDICATED_PATCH | TRANSDERMAL | Status: AC
Start: 2016-12-31 — End: ?
  Filled 2016-12-31: qty 1

## 2016-12-31 MED ORDER — DEXAMETHASONE SODIUM PHOSPHATE 4 MG/ML IJ SOLN
INTRAMUSCULAR | Status: AC
  Filled 2016-12-31: qty 1

## 2016-12-31 MED ORDER — CEFAZOLIN SODIUM-DEXTROSE 1-4 GM/50ML-% IV SOLN
INTRAVENOUS | Status: AC
  Filled 2016-12-31: qty 50

## 2016-12-31 MED ORDER — DOCUSATE SODIUM 100 MG PO CAPS
100.0000 mg | ORAL_CAPSULE | Freq: Two times a day (BID) | ORAL | Status: DC
  Administered 2016-12-31 – 2017-01-01 (×3): 100 mg via ORAL
  Filled 2016-12-31 (×3): qty 1

## 2016-12-31 MED ORDER — NEOSTIGMINE METHYLSULFATE 10 MG/10ML IV SOLN
INTRAVENOUS | Status: DC | PRN
  Administered 2016-12-31: 3 mg via INTRAVENOUS

## 2016-12-31 MED ORDER — ALBUTEROL SULFATE HFA 108 (90 BASE) MCG/ACT IN AERS: 1.0000 | INHALATION_SPRAY | Freq: Four times a day (QID) | RESPIRATORY_TRACT | Status: DC | PRN

## 2016-12-31 MED ORDER — PROMETHAZINE HCL 25 MG/ML IJ SOLN
INTRAMUSCULAR | Status: AC
  Filled 2016-12-31: qty 1

## 2016-12-31 MED ORDER — ALBUTEROL SULFATE (2.5 MG/3ML) 0.083% IN NEBU
2.5000 mg | INHALATION_SOLUTION | Freq: Four times a day (QID) | RESPIRATORY_TRACT | Status: DC | PRN
Start: 2016-12-31 — End: 2016-12-31

## 2016-12-31 MED ORDER — ALBUTEROL SULFATE (2.5 MG/3ML) 0.083% IN NEBU: 2.5000 mg | INHALATION_SOLUTION | Freq: Four times a day (QID) | RESPIRATORY_TRACT | Status: DC | PRN

## 2016-12-31 MED ORDER — ONDANSETRON HCL 4 MG/2ML IJ SOLN
INTRAMUSCULAR | Status: AC
  Filled 2016-12-31: qty 4

## 2016-12-31 MED ORDER — MIDAZOLAM HCL 2 MG/2ML IJ SOLN
INTRAMUSCULAR | Status: AC
  Filled 2016-12-31: qty 2

## 2016-12-31 MED ORDER — BUPIVACAINE-EPINEPHRINE (PF) 0.5% -1:200000 IJ SOLN
INTRAMUSCULAR | Status: DC | PRN
  Administered 2016-12-31: 18 mL via PERINEURAL

## 2016-12-31 MED ORDER — PROMETHAZINE HCL 25 MG/ML IJ SOLN
12.5000 mg | Freq: Once | INTRAMUSCULAR | Status: AC
  Administered 2016-12-31: 12.5 mg via INTRAVENOUS

## 2016-12-31 MED ORDER — SUGAMMADEX SODIUM 500 MG/5ML IV SOLN
INTRAVENOUS | Status: AC
  Filled 2016-12-31: qty 5

## 2016-12-31 MED ORDER — SEVOFLURANE IN SOLN
RESPIRATORY_TRACT | Status: AC
  Filled 2016-12-31: qty 250

## 2016-12-31 MED ORDER — ONDANSETRON HCL 4 MG/2ML IJ SOLN
4.0000 mg | Freq: Once | INTRAMUSCULAR | Status: AC
  Administered 2016-12-31: 4 mg via INTRAVENOUS

## 2016-12-31 MED ORDER — SCOPOLAMINE 1 MG/3DAYS TD PT72
1.0000 | MEDICATED_PATCH | Freq: Once | TRANSDERMAL | Status: DC
  Administered 2016-12-31: 1.5 mg via TRANSDERMAL

## 2016-12-31 MED ORDER — BISACODYL 10 MG RE SUPP: 10.0000 mg | Freq: Every day | RECTAL | Status: DC | PRN

## 2016-12-31 MED ORDER — STERILE WATER FOR IRRIGATION IR SOLN
Status: DC | PRN
  Administered 2016-12-31: 1000 mL

## 2016-12-31 MED ORDER — AMLODIPINE BESYLATE 5 MG PO TABS
5.0000 mg | ORAL_TABLET | Freq: Every day | ORAL | Status: DC
  Administered 2017-01-01: 5 mg via ORAL
  Filled 2016-12-31: qty 1

## 2016-12-31 MED ORDER — SODIUM CHLORIDE 0.9 % IV SOLN
8.0000 mg | Freq: Four times a day (QID) | INTRAVENOUS | Status: DC | PRN
  Administered 2016-12-31: 8 mg via INTRAVENOUS
  Filled 2016-12-31: qty 4

## 2016-12-31 MED ORDER — OXYCODONE-ACETAMINOPHEN 5-325 MG PO TABS: 1.0000 | ORAL_TABLET | ORAL | Status: DC | PRN

## 2016-12-31 MED ORDER — CEFAZOLIN SODIUM-DEXTROSE 2-4 GM/100ML-% IV SOLN
INTRAVENOUS | Status: AC
  Filled 2016-12-31: qty 100

## 2016-12-31 MED ORDER — KCL IN DEXTROSE-NACL 20-5-0.9 MEQ/L-%-% IV SOLN
INTRAVENOUS | Status: DC
  Administered 2016-12-31 (×2): via INTRAVENOUS

## 2016-12-31 MED ORDER — ONDANSETRON HCL 4 MG PO TABS
8.0000 mg | ORAL_TABLET | Freq: Four times a day (QID) | ORAL | Status: DC | PRN
  Administered 2016-12-31: 8 mg via ORAL
  Filled 2016-12-31: qty 2

## 2016-12-31 MED ORDER — SUMATRIPTAN SUCCINATE 50 MG PO TABS
50.0000 mg | ORAL_TABLET | ORAL | Status: DC | PRN
Start: 2016-12-31 — End: 2017-01-01

## 2016-12-31 MED ORDER — SODIUM CHLORIDE 0.9% FLUSH
INTRAVENOUS | Status: AC
  Filled 2016-12-31: qty 10

## 2016-12-31 MED ORDER — ONDANSETRON HCL 4 MG/2ML IJ SOLN
INTRAMUSCULAR | Status: AC
  Filled 2016-12-31: qty 2

## 2016-12-31 MED ORDER — HYDROMORPHONE HCL 1 MG/ML IJ SOLN
1.0000 mg | INTRAMUSCULAR | Status: DC | PRN
  Administered 2016-12-31 (×3): 1 mg via INTRAVENOUS
  Filled 2016-12-31 (×3): qty 1

## 2016-12-31 SURGICAL SUPPLY — 47 items
APPLIER CLIP 13 LRG OPEN (CLIP)
APR CLP LRG 13 20 CLIP (CLIP)
BAG HAMPER (MISCELLANEOUS) ×4 IMPLANT
BLADE SURG SZ10 CARB STEEL (BLADE) IMPLANT
CLIP APPLIE 13 LRG OPEN (CLIP) IMPLANT
CLOTH BEACON ORANGE TIMEOUT ST (SAFETY) ×4 IMPLANT
COVER LIGHT HANDLE STERIS (MISCELLANEOUS) ×8 IMPLANT
DECANTER SPIKE VIAL GLASS SM (MISCELLANEOUS) ×4 IMPLANT
DEVICE CAPIO SUTURING (INSTRUMENTS)
DEVICE CAPIO SUTURING OPC (INSTRUMENTS) IMPLANT
DRAPE HALF SHEET 40X57 (DRAPES) ×4 IMPLANT
DRAPE PROXIMA HALF (DRAPES) ×4 IMPLANT
DRAPE STERI URO 9X17 APER PCH (DRAPES) ×4 IMPLANT
ELECT REM PT RETURN 9FT ADLT (ELECTROSURGICAL) ×4
ELECTRODE REM PT RTRN 9FT ADLT (ELECTROSURGICAL) ×2 IMPLANT
FORMALIN 10 PREFIL 480ML (MISCELLANEOUS) ×4 IMPLANT
GAUZE SPONGE 4X4 12PLY STRL (GAUZE/BANDAGES/DRESSINGS) ×4 IMPLANT
GLOVE BIOGEL PI IND STRL 7.0 (GLOVE) ×4 IMPLANT
GLOVE BIOGEL PI IND STRL 8 (GLOVE) ×2 IMPLANT
GLOVE BIOGEL PI INDICATOR 7.0 (GLOVE) ×4
GLOVE BIOGEL PI INDICATOR 8 (GLOVE) ×2
GLOVE ECLIPSE 8.0 STRL XLNG CF (GLOVE) ×4 IMPLANT
GOWN STRL REUS W/TWL LRG LVL3 (GOWN DISPOSABLE) ×8 IMPLANT
GOWN STRL REUS W/TWL XL LVL3 (GOWN DISPOSABLE) ×4 IMPLANT
IV NS IRRIG 3000ML ARTHROMATIC (IV SOLUTION) ×4 IMPLANT
KIT ROOM TURNOVER AP CYSTO (KITS) ×4 IMPLANT
KIT ROOM TURNOVER APOR (KITS) ×4 IMPLANT
MANIFOLD NEPTUNE II (INSTRUMENTS) ×4 IMPLANT
NEEDLE HYPO 21X1.5 SAFETY (NEEDLE) ×4 IMPLANT
NEEDLE HYPO 22GX1.5 SAFETY (NEEDLE) ×4 IMPLANT
NS IRRIG 1000ML POUR BTL (IV SOLUTION) ×4 IMPLANT
PACK PERI GYN (CUSTOM PROCEDURE TRAY) ×4 IMPLANT
PAD ARMBOARD 7.5X6 YLW CONV (MISCELLANEOUS) ×4 IMPLANT
SET BASIN LINEN APH (SET/KITS/TRAYS/PACK) ×4 IMPLANT
SET IV ADMIN VERSALIGHT (MISCELLANEOUS) ×4 IMPLANT
SUT CAPIO POLYGLYCOLIC (SUTURE) IMPLANT
SUT MNCRL+ AB 3-0 CT1 36 (SUTURE) ×2 IMPLANT
SUT MON AB 3-0 SH 27 (SUTURE) ×4 IMPLANT
SUT MONOCRYL AB 3-0 CT1 36IN (SUTURE) ×2
SUT VIC AB 0 CT1 27 (SUTURE) ×12
SUT VIC AB 0 CT1 27XCR 8 STRN (SUTURE) ×6 IMPLANT
SUT VIC AB 0 CT2 8-18 (SUTURE) ×4 IMPLANT
SYR CONTROL 10ML LL (SYRINGE) ×4 IMPLANT
TOWEL OR 17X26 4PK STRL BLUE (TOWEL DISPOSABLE) ×4 IMPLANT
TRAY FOLEY W/METER SILVER 16FR (SET/KITS/TRAYS/PACK) ×4 IMPLANT
VERSALIGHT (MISCELLANEOUS) ×4 IMPLANT
WATER STERILE IRR 1000ML POUR (IV SOLUTION) ×4 IMPLANT

## 2016-12-31 NOTE — H&P (Signed)
Preoperative History and Physical  Marie Webb is a 46 y.o. [phone] with Patient's last menstrual period was 12/17/2016 (approximate). admitted for a TVH BSO anterior and posterior colporrhaphy for pelvic prolapse which has worsened acutely over the past few months.  I tried fitting for pessary even for short term management and would bnot stay in  Admitted for surgical management  PMH:    Past Medical History:  Diagnosis Date  . Asthma   . Complication of anesthesia   . Depression   . Headache   . Hypertension   . Legally blind   . Night terrors, adult   . PONV (postoperative nausea and vomiting)     PSH:     Past Surgical History:  Procedure Laterality Date  . ENDOMETRIAL ABLATION      POb/GynH:      OB History    Gravida Para Term Preterm AB Living   5 4 4   1 4    SAB TAB Ectopic Multiple Live Births   1       4      SH:   Social History  Substance Use Topics  . Smoking status: Never Smoker  . Smokeless tobacco: Never Used  . Alcohol use Yes     Comment: rare    FH:    Family History  Problem Relation Age of Onset  . Colon cancer Maternal Grandmother   . Colon cancer Mother   . Depression Mother   . Alcohol abuse Maternal Grandfather   . Cancer Brother   . Other Sister        killed in MVA  . Alcohol abuse Brother   . Asthma Son      Allergies:  Allergies  Allergen Reactions  . Stadol [Butorphanol] Other (See Comments)    Anxiety/tremors  . Advair Diskus [Fluticasone-Salmeterol] Other (See Comments)    Headache  . Sertraline Hcl Other (See Comments)    Worsened Headaches (failed to reduce headaches)    Medications:      No current facility-administered medications for this encounter.   Review of Systems:   Review of Systems  Constitutional: Negative for fever, chills, weight loss, malaise/fatigue and diaphoresis.  HENT: Negative for hearing loss, ear pain, nosebleeds, congestion, sore throat, neck pain, tinnitus and ear discharge.    Eyes: Negative for blurred vision, double vision, photophobia, pain, discharge and redness.  Respiratory: Negative for cough, hemoptysis, sputum production, shortness of breath, wheezing and stridor.   Cardiovascular: Negative for chest pain, palpitations, orthopnea, claudication, leg swelling and PND.  Gastrointestinal: Positive for abdominal pain. Negative for heartburn, nausea, vomiting, diarrhea, constipation, blood in stool and melena.  Genitourinary: Negative for dysuria, urgency, frequency, hematuria and flank pain.  Musculoskeletal: Negative for myalgias, back pain, joint pain and falls.  Skin: Negative for itching and rash.  Neurological: Negative for dizziness, tingling, tremors, sensory change, speech change, focal weakness, seizures, loss of consciousness, weakness and headaches.  Endo/Heme/Allergies: Negative for environmental allergies and polydipsia. Does not bruise/bleed easily.  Psychiatric/Behavioral: Negative for depression, suicidal ideas, hallucinations, memory loss and substance abuse. The patient is not nervous/anxious and does not have insomnia.      PHYSICAL EXAM:  Blood pressure (!) 151/100, pulse (!) 106, temperature 98.6 F (37 C), temperature source Oral, resp. rate 16, height 5\' 5"  (1.651 m), weight 163 lb (73.9 kg), last menstrual period 12/17/2016, SpO2 95 %.    Vitals reviewed. Constitutional: She is oriented to person, place, and time. She appears well-developed and  well-nourished.  HENT:  Head: Normocephalic and atraumatic.  Right Ear: External ear normal.  Left Ear: External ear normal.  Nose: Nose normal.  Mouth/Throat: Oropharynx is clear and moist.  Eyes: Conjunctivae and EOM are normal. Pupils are equal, round, and reactive to light. Right eye exhibits no discharge. Left eye exhibits no discharge. No scleral icterus.  Neck: Normal range of motion. Neck supple. No tracheal deviation present. No thyromegaly present.  Cardiovascular: Normal rate,  regular rhythm, normal heart sounds and intact distal pulses.  Exam reveals no gallop and no friction rub.   No murmur heard. Respiratory: Effort normal and breath sounds normal. No respiratory distress. She has no wheezes. She has no rales. She exhibits no tenderness.  GI: Soft. Bowel sounds are normal. She exhibits no distension and no mass. There is tenderness. There is no rebound and no guarding.  Genitourinary:       Vulva is normal without lesions Vagina is pink moist without discharge, Grade II cystocoel Cervix normal in appearance and pap is normal Uterus is Grade IV uterine [prolapse Grade III rectocoele Adnexa is negative with normal sized ovaries by sonogram  Musculoskeletal: Normal range of motion. She exhibits no edema and no tenderness.  Neurological: She is alert and oriented to person, place, and time. She has normal reflexes. She displays normal reflexes. No cranial nerve deficit. She exhibits normal muscle tone. Coordination normal.  Skin: Skin is warm and dry. No rash noted. No erythema. No pallor.  Psychiatric: She has a normal mood and affect. Her behavior is normal. Judgment and thought content normal.    Labs: Results for orders placed or performed during the hospital encounter of 12/26/16 (from the past 336 hour(s))  Surgical pcr screen   Collection Time: 12/26/16  3:02 PM  Result Value Ref Range   MRSA, PCR NEGATIVE NEGATIVE   Staphylococcus aureus NEGATIVE NEGATIVE  CBC   Collection Time: 12/26/16  3:02 PM  Result Value Ref Range   WBC 8.0 4.0 - 10.5 K/uL   RBC 4.83 3.87 - 5.11 MIL/uL   Hemoglobin 14.4 12.0 - 15.0 g/dL   HCT 16.1 09.6 - 04.5 %   MCV 87.8 78.0 - 100.0 fL   MCH 29.8 26.0 - 34.0 pg   MCHC 34.0 30.0 - 36.0 g/dL   RDW 40.9 81.1 - 91.4 %   Platelets 308 150 - 400 K/uL  Comprehensive metabolic panel   Collection Time: 12/26/16  3:02 PM  Result Value Ref Range   Sodium 136 135 - 145 mmol/L   Potassium 3.9 3.5 - 5.1 mmol/L   Chloride 100 (L)  101 - 111 mmol/L   CO2 24 22 - 32 mmol/L   Glucose, Bld 82 65 - 99 mg/dL   BUN 16 6 - 20 mg/dL   Creatinine, Ser 7.82 0.44 - 1.00 mg/dL   Calcium 9.3 8.9 - 95.6 mg/dL   Total Protein 7.9 6.5 - 8.1 g/dL   Albumin 4.3 3.5 - 5.0 g/dL   AST 17 15 - 41 U/L   ALT 14 14 - 54 U/L   Alkaline Phosphatase 85 38 - 126 U/L   Total Bilirubin 0.6 0.3 - 1.2 mg/dL   GFR calc non Af Amer >60 >60 mL/min   GFR calc Af Amer >60 >60 mL/min   Anion gap 12 5 - 15  hCG, quantitative, pregnancy   Collection Time: 12/26/16  3:02 PM  Result Value Ref Range   hCG, Beta Chain, Quant, S <1 <5 mIU/mL  Rapid HIV screen (HIV 1/2 Ab+Ag)   Collection Time: 12/26/16  3:02 PM  Result Value Ref Range   HIV-1 P24 Antigen - HIV24 NON REACTIVE NON REACTIVE   HIV 1/2 Antibodies NON REACTIVE NON REACTIVE   Interpretation (HIV Ag Ab)      A non reactive test result means that HIV 1 or HIV 2 antibodies and HIV 1 p24 antigen were not detected in the specimen.  Urinalysis, Routine w reflex microscopic   Collection Time: 12/26/16  3:02 PM  Result Value Ref Range   Color, Urine YELLOW YELLOW   APPearance CLEAR CLEAR   Specific Gravity, Urine 1.016 1.005 - 1.030   pH 6.0 5.0 - 8.0   Glucose, UA NEGATIVE NEGATIVE mg/dL   Hgb urine dipstick LARGE (A) NEGATIVE   Bilirubin Urine NEGATIVE NEGATIVE   Ketones, ur NEGATIVE NEGATIVE mg/dL   Protein, ur NEGATIVE NEGATIVE mg/dL   Nitrite NEGATIVE NEGATIVE   Leukocytes, UA NEGATIVE NEGATIVE   RBC / HPF 0-5 0 - 5 RBC/hpf   WBC, UA 0-5 0 - 5 WBC/hpf   Bacteria, UA NONE SEEN NONE SEEN   Squamous Epithelial / LPF NONE SEEN NONE SEEN  Type and screen   Collection Time: 12/26/16  3:02 PM  Result Value Ref Range   ABO/RH(D) B POS    Antibody Screen NEG    Sample Expiration 12/29/2016    Extend sample reason NO TRANSFUSIONS OR PREGNANCY IN THE PAST 3 MONTHS   Results for orders placed or performed in visit on 12/18/16 (from the past 336 hour(s))  Lipid panel   Collection Time:  12/18/16  4:54 PM  Result Value Ref Range   Cholesterol, Total 242 (H) 100 - 199 mg/dL   Triglycerides 161133 0 - 149 mg/dL   HDL 59 >09>39 mg/dL   VLDL Cholesterol Cal 27 5 - 40 mg/dL   LDL Calculated 604156 (H) 0 - 99 mg/dL   Chol/HDL Ratio 4.1 0.0 - 4.4 ratio  CBC   Collection Time: 12/18/16  4:54 PM  Result Value Ref Range   WBC 7.9 3.4 - 10.8 x10E3/uL   RBC 4.61 3.77 - 5.28 x10E6/uL   Hemoglobin 13.7 11.1 - 15.9 g/dL   Hematocrit 54.040.7 98.134.0 - 46.6 %   MCV 88 79 - 97 fL   MCH 29.7 26.6 - 33.0 pg   MCHC 33.7 31.5 - 35.7 g/dL   RDW 19.113.8 47.812.3 - 29.515.4 %   Platelets 313 150 - 379 x10E3/uL  Basic metabolic panel   Collection Time: 12/18/16  4:54 PM  Result Value Ref Range   Glucose 83 65 - 99 mg/dL   BUN 13 6 - 24 mg/dL   Creatinine, Ser 6.210.62 0.57 - 1.00 mg/dL   GFR calc non Af Amer 107 >59 mL/min/1.73   GFR calc Af Amer 123 >59 mL/min/1.73   BUN/Creatinine Ratio 21 9 - 23   Sodium 141 134 - 144 mmol/L   Potassium 4.2 3.5 - 5.2 mmol/L   Chloride 101 96 - 106 mmol/L   CO2 24 20 - 29 mmol/L   Calcium 9.1 8.7 - 10.2 mg/dL    EKG: Orders placed or performed during the hospital encounter of 12/26/16  . EKG 12-Lead  . EKG 12-Lead    Imaging Studies: None   Assessment: Grade IV uterine prolapse Grade II cystocoele Grade III rectocoele  Plan: TVH BSO anterior and posterior colporrhaphy  Pt understands the risks of surgery including but not limited t  excessive bleeding requiring  transfusion or reoperation, post-operative infection requiring prolonged hospitalization or re-hospitalization and antibiotic therapy, and damage to other organs including bladder, bowel, ureters and major vessels.  The patient also understands the alternative treatment options which were discussed in full.  All questions were answered.  Yves Fodor H 12/31/2016 7:44 AM   Amilyah Nack H 12/31/2016 7:42 AM

## 2016-12-31 NOTE — Anesthesia Postprocedure Evaluation (Signed)
Anesthesia Post Note  Patient: Marie Webb  Procedure(s) Performed: Procedure(s) (LRB): VAGINAL HYSTERECTOMY  WITH VAGINAL APEX SUSPENSION (N/A)  Patient location during evaluation: PACU Anesthesia Type: General Level of consciousness: awake and alert and oriented Pain management: pain level controlled Vital Signs Assessment: post-procedure vital signs reviewed and stable Respiratory status: spontaneous breathing Cardiovascular status: blood pressure returned to baseline Postop Assessment: no signs of nausea or vomiting Anesthetic complications: no     Last Vitals:  Vitals:   12/31/16 1130 12/31/16 1140  BP: (!) 114/95   Pulse: 99 94  Resp: 11 11  Temp:      Last Pain:  Vitals:   12/31/16 1152  TempSrc:   PainSc: 5                  Willim Turnage

## 2016-12-31 NOTE — Anesthesia Preprocedure Evaluation (Signed)
Anesthesia Evaluation  Patient identified by MRN, date of birth, ID band Patient awake    Reviewed: Allergy & Precautions, NPO status , Patient's Chart, lab work & pertinent test results  History of Anesthesia Complications (+) PONV and history of anesthetic complications  Airway        Dental   Pulmonary asthma ,    breath sounds clear to auscultation       Cardiovascular hypertension,  Rhythm:Regular Rate:Normal     Neuro/Psych  Headaches, PSYCHIATRIC DISORDERS Anxiety Depression    GI/Hepatic negative GI ROS, Neg liver ROS,   Endo/Other  negative endocrine ROS  Renal/GU negative Renal ROS     Musculoskeletal   Abdominal   Peds  Hematology   Anesthesia Other Findings   Reproductive/Obstetrics                             Anesthesia Physical Anesthesia Plan  ASA: II  Anesthesia Plan: General   Post-op Pain Management:    Induction: Intravenous  PONV Risk Score and Plan:   Airway Management Planned: Oral ETT  Additional Equipment:   Intra-op Plan:   Post-operative Plan: Extubation in OR  Informed Consent: I have reviewed the patients History and Physical, chart, labs and discussed the procedure including the risks, benefits and alternatives for the proposed anesthesia with the patient or authorized representative who has indicated his/her understanding and acceptance.     Plan Discussed with:   Anesthesia Plan Comments:         Anesthesia Quick Evaluation

## 2016-12-31 NOTE — Transfer of Care (Signed)
Immediate Anesthesia Transfer of Care Note  Patient: [place] Christel MormonP Dowding  Procedure(s) Performed: Procedure(s): VAGINAL HYSTERECTOMY  WITH VAGINAL APEX SUSPENSION (N/A)  Patient Location: PACU  Anesthesia Type:General  Level of Consciousness: awake and alert   Airway & Oxygen Therapy: Patient Spontanous Breathing and Patient connected to face mask oxygen  Post-op Assessment: Report given to RN  Post vital signs: Reviewed and stable  Last Vitals:  Vitals:   12/31/16 0815 12/31/16 0820  BP: 136/90 128/85  Pulse:    Resp: 19 16  Temp:      Last Pain:  Vitals:   12/31/16 0739  TempSrc: Oral      Patients Stated Pain Goal: 5 (12/31/16 0739)  Complications: No apparent anesthesia complications

## 2016-12-31 NOTE — Progress Notes (Signed)
Patients foley removed per order at 1845.  Small amount of vaginal bleeding noted.  Peri pad changed.  Patient up to BR to change underwear, became nauseated again and vomited clear fluid x 1.  Patient assisted back to bed, AC called to mix PRN dose of IV zofran.

## 2016-12-31 NOTE — Anesthesia Procedure Notes (Signed)
Procedure Name: Intubation Date/Time: 12/31/2016 8:36 AM Performed by: Tressie Stalker E Pre-anesthesia Checklist: Patient identified, Patient being monitored, Timeout performed, Emergency Drugs available and Suction available Patient Re-evaluated:Patient Re-evaluated prior to induction Oxygen Delivery Method: Circle system utilized Preoxygenation: Pre-oxygenation with 100% oxygen Induction Type: IV induction Ventilation: Mask ventilation without difficulty Laryngoscope Size: Mac and 3 Grade View: Grade I Tube type: Oral Tube size: 7.0 mm Number of attempts: 1 Airway Equipment and Method: Stylet Placement Confirmation: ETT inserted through vocal cords under direct vision,  positive ETCO2 and breath sounds checked- equal and bilateral Secured at: 21 cm Tube secured with: Tape Dental Injury: Teeth and Oropharynx as per pre-operative assessment

## 2016-12-31 NOTE — Op Note (Signed)
Preoperative diagnosis:  1.  Grade 4 uterine prolapse                                         2.  Grade 2 cystocele                                         3.  Grade 3 rectocele                                         4.  Symptomatic pelvic relaxation  Postoperative diagnosis:  Same as above + significant improvement and cystocele and rectocele after uterus was removed and McCall culdoplasty performed  Procedure:  Vaginal hysterectomy  Surgeon:  Lazaro ArmsLuther H Eure MD  Anesthesia:  General Endotracheal  Findings:  Marie Webb is a [age] y.o. [id number] with Patient's last menstrual period was 12/17/2016 (approximate). admitted for a TVH BSO anterior and posterior colporrhaphy for pelvic prolapse which has worsened acutely over the past few months.  I tried fitting for pessary even for short term management and would bnot stay in    Intraoperative findings were the findings were confirmed at the time of surgery.  However interestingly once I remove the uterus she really didn't have a dramatic degree of cystocele or rectocele.  Certainly it was not normal and I told the patient and her husband this.  But if she had come to my office without a uterus and this degree of relaxation at this point in her life I would not have recommended surgery.  I had to make a gain time decision and felt like it was in the best patient's best interest at this point in her life not to do an anterior and posterior colporrhaphy.  Certainly in her lifetime that is can have to be done, but at this point I really feel like she's probably got a good decade before she'll have to have this addressed.  Additionally would plan to take out her tubes and ovaries but they could not be removed vaginally and preoperatively we discussed the need for laparoscopic removal and patient did not want to do that.  If I couldn't get them out vaginally then she wasn't interested in removal.  Description of operation:  The patient was taken from  the preoperative area to the operating room in stable condition. She was placed in the sitting position and underwent a spinal anesthetic. Once an adequate level of anesthesia was attained she was placed in the dorsal lithotomy position. Patient was prepped and draped in the usual sterile fashion and a Foley catheter was placed.  A weighted speculum was placed and the cervix was grasped with thyroid tenaculums both anteriorly and posteriorly.  0.5% Marcaine plain was injected in a circumferential fashion about the cervix and the electrocautery unit was used to incise the vagina and push at all cervix.  The posterior cul-de-sac was then entered sharply without difficulty.  The uterosacral ligaments were clamped cut and inspection suture ligated and held.  The cardinal ligaments were then clamped cut transfixion suture ligated and cut. The anterior peritoneum was identified the anterior cul-de-sac was entered sharply without difficulty. The anterior and posterior leaves of the  broad ligament were plicated and the uterine vessels were clamped cut and suture ligated. Serial pedicles were taken of the fundus with each pedicle being clamped cut and suture ligated. The utero-ovarian ligaments were crossclamped the uterus was removed and both pedicles were transfixion suture ligated. There was good hemostasis of all the pedicles. The peritoneum was then closed in a pursestring fashion using 3-0 Vicryl. The anterior posterior vagina was closed in interrupted fashion with good resultant hemostasis. Our closure the lower pelvis and vagina were irrigated vigorously.  The sponge needle and instrument counts were correct x 3.  Total blood loss for the procedure was 100 cc.  The patient received 2 g of Ancef and 30 mg of Toradol IV preoperatively prophylactically.  She was taken to the recovery room in good stable condition awake alert doing well.  EURE,LUTHER H 12/31/2016, 11:09 AM

## 2017-01-01 ENCOUNTER — Encounter (HOSPITAL_COMMUNITY): Payer: Self-pay | Admitting: Obstetrics & Gynecology

## 2017-01-01 DIAGNOSIS — N813 Complete uterovaginal prolapse: Secondary | ICD-10-CM | POA: Diagnosis not present

## 2017-01-01 LAB — CBC
HCT: 35.8 % — ABNORMAL LOW (ref 36.0–46.0)
HEMOGLOBIN: 12.4 g/dL (ref 12.0–15.0)
MCH: 30.9 pg (ref 26.0–34.0)
MCHC: 34.6 g/dL (ref 30.0–36.0)
MCV: 89.3 fL (ref 78.0–100.0)
Platelets: 261 10*3/uL (ref 150–400)
RBC: 4.01 MIL/uL (ref 3.87–5.11)
RDW: 12.9 % (ref 11.5–15.5)
WBC: 10.5 10*3/uL (ref 4.0–10.5)

## 2017-01-01 LAB — BASIC METABOLIC PANEL
Anion gap: 6 (ref 5–15)
BUN: 6 mg/dL (ref 6–20)
CHLORIDE: 105 mmol/L (ref 101–111)
CO2: 27 mmol/L (ref 22–32)
Calcium: 8.2 mg/dL — ABNORMAL LOW (ref 8.9–10.3)
Creatinine, Ser: 0.53 mg/dL (ref 0.44–1.00)
GFR calc Af Amer: >60 mL/min (ref >60)
GFR calc non Af Amer: >60 mL/min (ref >60)
Glucose, Bld: 108 mg/dL — ABNORMAL HIGH (ref 65–99)
POTASSIUM: 3.8 mmol/L (ref 3.5–5.1)
SODIUM: 138 mmol/L (ref 135–145)

## 2017-01-01 MED ORDER — KETOROLAC TROMETHAMINE 10 MG PO TABS: 10.0000 mg | ORAL_TABLET | Freq: Three times a day (TID) | ORAL | 0 refills | Status: DC | PRN

## 2017-01-01 MED ORDER — OXYCODONE-ACETAMINOPHEN 5-325 MG PO TABS: 1.0000 | ORAL_TABLET | ORAL | 0 refills | Status: DC | PRN

## 2017-01-01 MED ORDER — KETOROLAC TROMETHAMINE 30 MG/ML IJ SOLN
30.0000 mg | Freq: Once | INTRAMUSCULAR | Status: AC
Start: 2017-01-01 — End: 2017-01-01
  Administered 2017-01-01: 30 mg via INTRAVENOUS
  Filled 2017-01-01: qty 1

## 2017-01-01 MED ORDER — ONDANSETRON HCL 8 MG PO TABS: 8.0000 mg | ORAL_TABLET | Freq: Four times a day (QID) | ORAL | 0 refills | Status: DC | PRN

## 2017-01-01 MED ORDER — CIPROFLOXACIN HCL 500 MG PO TABS: 500.0000 mg | ORAL_TABLET | Freq: Two times a day (BID) | ORAL | 0 refills | Status: DC

## 2017-01-01 NOTE — Progress Notes (Signed)
IV discontinued, catheter intact. Patient discharged with instructions given on medications,and follow up visits, patient verbalized understanding . Accompanied by staff to an awaiting.

## 2017-01-01 NOTE — Discharge Instructions (Signed)
Vaginal Hysterectomy, Care After °Refer to this sheet in the next few weeks. These instructions provide you with information about caring for yourself after your procedure. Your health care provider may also give you more specific instructions. Your treatment has been planned according to current medical practices, but problems sometimes occur. Call your health care provider if you have any problems or questions after your procedure. °What can I expect after the procedure? °After the procedure, it is common to have: °· Pain. °· Soreness and numbness in your incision areas. °· Vaginal bleeding and discharge. °· Constipation. °· Temporary problems emptying the bladder. °· Feelings of sadness or other emotions. ° °Follow these instructions at home: °Medicines °· Take over-the-counter and prescription medicines only as told by your health care provider. °· If you were prescribed an antibiotic medicine, take it as told by your health care provider. Do not stop taking the antibiotic even if you start to feel better. °· Do not drive or operate heavy machinery while taking prescription pain medicine. °Activity °· Return to your normal activities as told by your health care provider. Ask your health care provider what activities are safe for you. °· Get regular exercise as told by your health care provider. You may be told to take short walks every day and go farther each time. °· Do not lift anything that is heavier than 10 lb (4.5 kg). °General instructions ° °· Do not put anything in your vagina for 6 weeks after your surgery or as told by your health care provider. This includes tampons and douches. °· Do not have sex until your health care provider says you can. °· Do not take baths, swim, or use a hot tub until your health care provider approves. °· Drink enough fluid to keep your urine clear or pale yellow. °· Do not drive for 24 hours if you were given a sedative. °· Keep all follow-up visits as told by your health  care provider. This is important. °Contact a health care provider if: °· Your pain medicine is not helping. °· You have a fever. °· You have redness, swelling, or pain at your incision site. °· You have blood, pus, or a bad-smelling discharge from your vagina. °· You continue to have difficulty urinating. °Get help right away if: °· You have severe abdominal or back pain. °· You have heavy bleeding from your vagina. °· You have chest pain or shortness of breath. °This information is not intended to replace advice given to you by your health care provider. Make sure you discuss any questions you have with your health care provider. °Document Released: 09/03/2015 Document Revised: 10/18/2015 Document Reviewed: 05/27/2015 °Elsevier Interactive Patient Education © 2018 [employer] ° °

## 2017-01-01 NOTE — Discharge Summary (Signed)
Physician Discharge Summary  Patient ID: Marie Webb MRN: [id number] DOB/AGE: 09/11/1970 46 y.o.  Admit date: 12/31/2016 Discharge date: 01/01/2017  Admission Diagnoses: Grade 4 uterine prolapse Grade 2 cystocele Grade 3 rectocele  Discharge Diagnoses:  Active Problems:   S/P vaginal hysterectomy   Discharged Condition: stable  Hospital Course: Unremarkable postoperative course  Consults: None  Significant Diagnostic Studies: labs:   Treatments: surgery: Vaginal hysterectomy  Discharge Exam: Blood pressure 131/74, pulse 94, temperature 98.5 F (36.9 C), temperature source Oral, resp. rate 16, height 5\' 5"  (1.651 m), weight 178 lb (80.7 kg), last menstrual period 12/17/2016, SpO2 97 %. General appearance: alert, cooperative and no distress GI: soft, non-tender; bowel sounds normal; no masses,  no organomegaly  No vaginal bleeding  Disposition: 01-Home or Self Care  Discharge Instructions    Call MD for:  persistant nausea and vomiting    Complete by:  As directed    Call MD for:  severe uncontrolled pain    Complete by:  As directed    Call MD for:  temperature >100.4    Complete by:  As directed    Diet - low sodium heart healthy    Complete by:  As directed    Driving Restrictions    Complete by:  As directed    Do not drive for a week   Increase activity slowly    Complete by:  As directed    Lifting restrictions    Complete by:  As directed    Do not lift more than 10 pounds   No wound care    Complete by:  As directed    Sexual Activity Restrictions    Complete by:  As directed    You are kidding right?     Allergies as of 01/01/2017      Reactions   Stadol [butorphanol] Other (See Comments)   Anxiety/tremors   Advair Diskus [fluticasone-salmeterol] Other (See Comments)   Headache   Sertraline Hcl Other (See Comments)   Worsened Headaches (failed to reduce headaches)      Medication List    STOP taking these medications   ibuprofen 200 MG  tablet Commonly known as:  ADVIL,MOTRIN     TAKE these medications   albuterol 108 (90 Base) MCG/ACT inhaler Commonly known as:  PROVENTIL HFA;VENTOLIN HFA Inhale 1-2 puffs into the lungs every 6 (six) hours as needed for wheezing or shortness of breath.   albuterol (2.5 MG/3ML) 0.083% nebulizer solution Commonly known as:  PROVENTIL Take 3 mLs (2.5 mg total) by nebulization every 6 (six) hours as needed for wheezing or shortness of breath.   amitriptyline 25 MG tablet Commonly known as:  ELAVIL TAKE TWO TABLETS BY MOUTH AT BEDTIME   amLODipine 5 MG tablet Commonly known as:  NORVASC Take 1 tablet (5 mg total) by mouth daily.   aspirin-acetaminophen-caffeine 250-250-65 MG tablet Commonly known as:  EXCEDRIN MIGRAINE Take 2 tablets by mouth every 8 (eight) hours as needed for headache or migraine.   ciprofloxacin 500 MG tablet Commonly known as:  CIPRO Take 1 tablet (500 mg total) by mouth 2 (two) times daily.   FISH OIL ULTRA 1400 MG Caps Take 1,400 mg by mouth daily.   fluticasone furoate-vilanterol 200-25 MCG/INH Aepb Commonly known as:  BREO ELLIPTA Inhale 1 puff into the lungs daily. What changed:  when to take this   ketorolac 10 MG tablet Commonly known as:  TORADOL Take 1 tablet (10 mg total) by mouth every 8 (  eight) hours as needed.   Nebulizer Air Tube/Plugs Misc Nebulizer tubing   ondansetron 8 MG tablet Commonly known as:  ZOFRAN Take 1 tablet (8 mg total) by mouth every 6 (six) hours as needed for nausea.   oxyCODONE-acetaminophen 5-325 MG tablet Commonly known as:  PERCOCET/ROXICET Take 1-2 tablets by mouth every 4 (four) hours as needed (moderate to severe pain (when tolerating fluids)).   SUMAtriptan 50 MG tablet Commonly known as:  IMITREX Take 1 tablet (50 mg total) by mouth every 2 (two) hours as needed for migraine. May repeat in 2 hours if headache persists or recurs.      Follow-up Information    Lazaro Arms, MD Follow up on  01/08/2017.   Specialties:  Obstetrics and Gynecology, Radiology Why:  post op visit Contact information: [address] [place] [address] [phone]           Signed: Lazaro Arms 01/01/2017, 10:54 AM

## 2017-01-01 NOTE — Progress Notes (Signed)
Pt has voided following catheter removal and reports no issues. Pt still having nausea without emesis with activity and says nausea "goes away completely if I sit still", will provide pt with zofran when available.

## 2017-01-04 LAB — TYPE AND SCREEN
ABO/RH(D): B POS
ANTIBODY SCREEN: NEGATIVE

## 2017-01-08 ENCOUNTER — Encounter: Payer: Self-pay | Admitting: Obstetrics & Gynecology

## 2017-01-08 ENCOUNTER — Ambulatory Visit (INDEPENDENT_AMBULATORY_CARE_PROVIDER_SITE_OTHER): Payer: 59 | Admitting: Obstetrics & Gynecology

## 2017-01-08 VITALS — BP 100/70 | HR 76 | Wt 161.0 lb

## 2017-01-08 DIAGNOSIS — Z9889 Other specified postprocedural states: Secondary | ICD-10-CM

## 2017-01-08 DIAGNOSIS — Z90710 Acquired absence of both cervix and uterus: Secondary | ICD-10-CM

## 2017-01-08 NOTE — Progress Notes (Signed)
  HPI: Patient returns for routine postoperative follow-up having undergone TVH on 12/31/2016.  The patient's immediate postoperative recovery has been unremarkable. Since hospital discharge the patient reports no problems.   Current Outpatient Prescriptions: amitriptyline (ELAVIL) 25 MG tablet, TAKE TWO TABLETS BY MOUTH AT BEDTIME, Disp: 180 tablet, Rfl: 3 amLODipine (NORVASC) 5 MG tablet, Take 1 tablet (5 mg total) by mouth daily., Disp: 90 tablet, Rfl: 3 ciprofloxacin (CIPRO) 500 MG tablet, Take 1 tablet (500 mg total) by mouth 2 (two) times daily., Disp: 14 tablet, Rfl: 0 fluticasone furoate-vilanterol (BREO ELLIPTA) 200-25 MCG/INH AEPB, Inhale 1 puff into the lungs daily. (Patient taking differently: Inhale 1 puff into the lungs every evening. ), Disp: 90 each, Rfl: 3 Omega-3 Fatty Acids (FISH OIL ULTRA) 1400 MG CAPS, Take 1,400 mg by mouth daily., Disp: , Rfl:  albuterol (PROVENTIL HFA;VENTOLIN HFA) 108 (90 Base) MCG/ACT inhaler, Inhale 1-2 puffs into the lungs every 6 (six) hours as needed for wheezing or shortness of breath. (Patient not taking: Reported on 01/08/2017), Disp: 18 g, Rfl: 1 albuterol (PROVENTIL) (2.5 MG/3ML) 0.083% nebulizer solution, Take 3 mLs (2.5 mg total) by nebulization every 6 (six) hours as needed for wheezing or shortness of breath. (Patient not taking: Reported on 01/08/2017), Disp: 75 mL, Rfl: 12 aspirin-acetaminophen-caffeine (EXCEDRIN MIGRAINE) 250-250-65 MG per tablet, Take 2 tablets by mouth every 8 (eight) hours as needed for headache or migraine. , Disp: , Rfl:  ketorolac (TORADOL) 10 MG tablet, Take 1 tablet (10 mg total) by mouth every 8 (eight) hours as needed. (Patient not taking: Reported on 01/08/2017), Disp: 15 tablet, Rfl: 0 ondansetron (ZOFRAN) 8 MG tablet, Take 1 tablet (8 mg total) by mouth every 6 (six) hours as needed for nausea. (Patient not taking: Reported on 01/08/2017), Disp: 20 tablet, Rfl: 0 oxyCODONE-acetaminophen (PERCOCET/ROXICET) 5-325 MG  tablet, Take 1-2 tablets by mouth every 4 (four) hours as needed (moderate to severe pain (when tolerating fluids)). (Patient not taking: Reported on 01/08/2017), Disp: 30 tablet, Rfl: 0 Respiratory Therapy Supplies (NEBULIZER AIR TUBE/PLUGS) MISC, Nebulizer tubing (Patient not taking: Reported on 12/18/2016), Disp: 1 each, Rfl: 2 SUMAtriptan (IMITREX) 50 MG tablet, Take 1 tablet (50 mg total) by mouth every 2 (two) hours as needed for migraine. May repeat in 2 hours if headache persists or recurs. (Patient not taking: Reported on 01/08/2017), Disp: 10 tablet, Rfl: 6  No current facility-administered medications for this visit.     Blood pressure 100/70, pulse 76, weight 161 lb (73 kg), last menstrual period 12/17/2016.  Physical Exam: Abdomen soft vaginal cuff is healing well  Diagnostic Tests:   Pathology: benign  Impression: S/p TVH  Plan:   Follow up: 4  weeks  Lazaro ArmsEURE,Law Corsino H, MD

## 2017-01-20 ENCOUNTER — Encounter: Payer: 59 | Admitting: Obstetrics & Gynecology

## 2017-01-29 ENCOUNTER — Encounter: Payer: 59 | Admitting: Obstetrics & Gynecology

## 2017-02-02 ENCOUNTER — Encounter: Payer: 59 | Admitting: Obstetrics & Gynecology

## 2017-02-05 ENCOUNTER — Ambulatory Visit (INDEPENDENT_AMBULATORY_CARE_PROVIDER_SITE_OTHER): Payer: 59 | Admitting: Obstetrics & Gynecology

## 2017-02-05 ENCOUNTER — Encounter: Payer: Self-pay | Admitting: Obstetrics & Gynecology

## 2017-02-05 ENCOUNTER — Encounter: Payer: 59 | Admitting: Obstetrics & Gynecology

## 2017-02-05 VITALS — BP 124/80 | HR 109 | Ht 65.0 in | Wt 165.0 lb

## 2017-02-05 DIAGNOSIS — Z90710 Acquired absence of both cervix and uterus: Secondary | ICD-10-CM

## 2017-02-05 DIAGNOSIS — Z9889 Other specified postprocedural states: Secondary | ICD-10-CM

## 2017-02-05 MED ORDER — ESTROGENS, CONJUGATED 0.625 MG/GM VA CREA: TOPICAL_CREAM | VAGINAL | 12 refills | Status: DC

## 2017-02-05 NOTE — Progress Notes (Signed)
  HPI: Patient returns for routine postoperative follow-up having undergone TVH on 12/31/2016.  The patient's immediate postoperative recovery has been unremarkable. Since hospital discharge the patient reports no problems, no discharge.   Current Outpatient Prescriptions: albuterol (PROVENTIL HFA;VENTOLIN HFA) 108 (90 Base) MCG/ACT inhaler, Inhale 1-2 puffs into the lungs every 6 (six) hours as needed for wheezing or shortness of breath., Disp: 18 g, Rfl: 1 albuterol (PROVENTIL) (2.5 MG/3ML) 0.083% nebulizer solution, Take 3 mLs (2.5 mg total) by nebulization every 6 (six) hours as needed for wheezing or shortness of breath., Disp: 75 mL, Rfl: 12 amitriptyline (ELAVIL) 25 MG tablet, TAKE TWO TABLETS BY MOUTH AT BEDTIME, Disp: 180 tablet, Rfl: 3 amLODipine (NORVASC) 5 MG tablet, Take 1 tablet (5 mg total) by mouth daily., Disp: 90 tablet, Rfl: 3 aspirin-acetaminophen-caffeine (EXCEDRIN MIGRAINE) 250-250-65 MG per tablet, Take 2 tablets by mouth every 8 (eight) hours as needed for headache or migraine. , Disp: , Rfl:  fluticasone furoate-vilanterol (BREO ELLIPTA) 200-25 MCG/INH AEPB, Inhale 1 puff into the lungs daily. (Patient taking differently: Inhale 1 puff into the lungs every evening. ), Disp: 90 each, Rfl: 3 Omega-3 Fatty Acids (FISH OIL ULTRA) 1400 MG CAPS, Take 1,400 mg by mouth daily., Disp: , Rfl:  Respiratory Therapy Supplies (NEBULIZER AIR TUBE/PLUGS) MISC, Nebulizer tubing, Disp: 1 each, Rfl: 2 SUMAtriptan (IMITREX) 50 MG tablet, Take 1 tablet (50 mg total) by mouth every 2 (two) hours as needed for migraine. May repeat in 2 hours if headache persists or recurs., Disp: 10 tablet, Rfl: 6  No current facility-administered medications for this visit.     Blood pressure 124/80, pulse (!) 109, height 5\' 5"  (1.651 m), weight 165 lb (74.8 kg), last menstrual period 12/17/2016.  Physical Exam: Vagina is well healed and the cuff is nearly completely healed Adnexa is negative No apex  descent  Diagnostic Tests:   Pathology: benign  Impression: S/p TVH  Plan: Begin premarin cream to help with prolapse issues for the future May begin exercise 1 week Sex 3 weeks and take it easy at first  Follow up: 1  years  Lazaro ArmsEURE,Freman Lapage H, MD

## 2017-02-12 ENCOUNTER — Telehealth: Payer: Self-pay | Admitting: *Deleted

## 2017-02-12 NOTE — Telephone Encounter (Signed)
Left message x 1. JSY 

## 2017-02-13 NOTE — Telephone Encounter (Signed)
Left message x 2. JSY 

## 2017-02-16 NOTE — Telephone Encounter (Signed)
Left message x 3. JSY 

## 2017-02-16 NOTE — Telephone Encounter (Signed)
3 messages left for pt to return call. Never heard from pt. Encounter closed. JSY 

## 2017-02-17 ENCOUNTER — Ambulatory Visit (INDEPENDENT_AMBULATORY_CARE_PROVIDER_SITE_OTHER): Payer: 59 | Admitting: Internal Medicine

## 2017-02-17 ENCOUNTER — Encounter: Payer: Self-pay | Admitting: Internal Medicine

## 2017-02-17 VITALS — BP 119/80 | HR 98 | Temp 97.9°F | Ht 65.0 in | Wt 164.4 lb

## 2017-02-17 DIAGNOSIS — F329 Major depressive disorder, single episode, unspecified: Secondary | ICD-10-CM | POA: Diagnosis not present

## 2017-02-17 DIAGNOSIS — G5600 Carpal tunnel syndrome, unspecified upper limb: Secondary | ICD-10-CM

## 2017-02-17 DIAGNOSIS — G5601 Carpal tunnel syndrome, right upper limb: Secondary | ICD-10-CM | POA: Diagnosis not present

## 2017-02-17 DIAGNOSIS — F32A Depression, unspecified: Secondary | ICD-10-CM

## 2017-02-17 DIAGNOSIS — Z23 Encounter for immunization: Secondary | ICD-10-CM

## 2017-02-17 DIAGNOSIS — I10 Essential (primary) hypertension: Secondary | ICD-10-CM

## 2017-02-17 HISTORY — DX: Carpal tunnel syndrome, unspecified upper limb: G56.00
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
## 2017-02-17 NOTE — Assessment & Plan Note (Signed)
Well-controlled on current dose of amlodipine . BP at goal at home per patient and at office today (119/80).  - Continue amlodipine 

## 2017-02-17 NOTE — Patient Instructions (Addendum)
It was nice seeing you again today Marie Webb!  Please increase your amitriptyline to  per day. You can continue taking the  tablets that you already have, just increase the dose.   I have placed a referral for you to our sports medicine clinic. They will evaluate you as well and determine if you will benefit from injections or not.   Please continue taking amlodipine  as you have been.   I will see you back in one month to see how your mood is on the increased dose of amitriptyline. If you have thoughts of hurting yourself or someone else, please call our office, go to the emergency room, or call 911.   If you have any questions or concerns, please feel free to call the clinic.   Be well,  Dr. Natale Milch

## 2017-02-17 NOTE — Assessment & Plan Note (Signed)
PH9 score of 9, meeting criteria for mild depression. Recent new stressor of relationship issues with her son. Safe at home however. Denies SI or HI but has had passing thoughts of being better off dead. Already on amitriptyline  qd, so hesitant to combine with another antidepressant. Patient's migraines well-controlled on amitriptyline, so also hesitant to discontinue this. Will increase dose to 75 mg (patient not wanting to increase any higher than this) and f/u in 4 weeks. Encouraged to seek help if develops HI or SI.

## 2017-02-17 NOTE — Assessment & Plan Note (Signed)
Meets criteria with distribution of numbness and positive Phalen and Tinel. Patient has already tried braces and NSAIDs with no improvement in symptoms. Now awakening at night due to numbness, with symptoms also worsening during the day. Interested in injections. Will refer to sports med for diagnostic US and possible injections if deemed beneficial.

## 2017-02-18 ENCOUNTER — Telehealth: Payer: Self-pay | Admitting: Internal Medicine

## 2017-02-18 MED ORDER — AMITRIPTYLINE HCL 75 MG PO TABS
75.0000 mg | ORAL_TABLET | Freq: Every day | ORAL | 0 refills | Status: DC
Start: 2017-02-18 — End: 2017-04-29

## 2017-02-18 NOTE — Addendum Note (Signed)
Addended by: Tarri Abernethy on: 02/18/2017 12:14 PM   Modules accepted: Orders

## 2017-02-18 NOTE — Telephone Encounter (Signed)
Prescription for  tablets sent to patient's requested pharmacy.   Tarri Abernethy, MD, MPH PGY-3 [facility] Pager [phone]

## 2017-02-18 NOTE — Telephone Encounter (Signed)
Dr Natale Milch increased her Elavil to 75 mg per day.  In order to have her insurance pay for it, the Rx needs to have a new Rx.  She uses the  [facility] on [address]. Please advise

## 2017-02-25 ENCOUNTER — Telehealth: Payer: Self-pay | Admitting: *Deleted

## 2017-02-25 NOTE — Telephone Encounter (Signed)
LMOVM returning call 

## 2017-02-26 NOTE — Telephone Encounter (Signed)
Pt called with question regarding her premarin vaginal cream. She is asking if she should use it nightly, which is what she thought Dr Despina Hidden told her, or 3 times a week as the pharmacists recommends. Advised pt that I would send him a message for clarification and call her back with an answer.

## 2017-02-27 NOTE — Telephone Encounter (Signed)
Nightly until further notice, post op is different than general post menopausal  administration

## 2017-02-27 NOTE — Telephone Encounter (Signed)
Advised pt to use premarin cream nightly, per Dr Despina Hidden, until further notice

## 2017-03-17 ENCOUNTER — Other Ambulatory Visit: Payer: Self-pay | Admitting: Family Medicine

## 2017-03-17 DIAGNOSIS — Z1231 Encounter for screening mammogram for malignant neoplasm of breast: Secondary | ICD-10-CM

## 2017-03-23 ENCOUNTER — Other Ambulatory Visit: Payer: Self-pay | Admitting: Family Medicine

## 2017-03-29 ENCOUNTER — Other Ambulatory Visit: Payer: Self-pay | Admitting: Family Medicine

## 2017-03-30 ENCOUNTER — Encounter (HOSPITAL_COMMUNITY): Payer: Self-pay

## 2017-03-30 ENCOUNTER — Ambulatory Visit (HOSPITAL_COMMUNITY)
Admission: RE | Admit: 2017-03-30 | Discharge: 2017-03-30 | Disposition: A | Discharge status: Home / Self Care | Payer: 59 | Source: Ambulatory Visit | Attending: Family Medicine | Admitting: Family Medicine

## 2017-03-30 ENCOUNTER — Encounter: Payer: Self-pay | Admitting: Obstetrics & Gynecology

## 2017-03-30 DIAGNOSIS — Z1231 Encounter for screening mammogram for malignant neoplasm of breast: Secondary | ICD-10-CM | POA: Insufficient documentation

## 2017-03-30 NOTE — Progress Notes (Signed)
Preoperative History and Physical  Marie Webb is a 46 y.o. [id number] with Patient's last menstrual period was 12/17/2016 (approximate). admitted for a TVH BSO anterior and posterior colporrhaphy for pelvic prolapse which has worsened acutely over the past few months.  I tried fitting for pessary even for short term management and would bnot stay in  Admitted for surgical management  PMH:      Past Medical History:  Diagnosis Date  . Asthma   . Complication of anesthesia   . Depression   . Headache   . Hypertension   . Legally blind   . Night terrors, adult   . PONV (postoperative nausea and vomiting)    PSH:       Past Surgical History:  Procedure Laterality Date  . ENDOMETRIAL ABLATION     POb/GynH:          OB History     Gravida Para Term Preterm AB Living   5 4 4  1 4     SAB TAB Ectopic Multiple Live Births   1    4      SH:         Social History   Substance Use Topics   . Smoking status: Never Smoker   . Smokeless tobacco: Never Used   . Alcohol use Yes     Comment: rare    FH:       Family History  Problem Relation Age of Onset  . Colon cancer Maternal Grandmother   . Colon cancer Mother   . Depression Mother   . Alcohol abuse Maternal Grandfather   . Cancer Brother   . Other Sister    killed in MVA  . Alcohol abuse Brother   . Asthma Son    Allergies:       Allergies  Allergen Reactions  . Stadol [Butorphanol] Other (See Comments)    Anxiety/tremors  . Advair Diskus [Fluticasone-Salmeterol] Other (See Comments)    Headache  . Sertraline Hcl Other (See Comments)    Worsened Headaches (failed to reduce headaches)   Medications: No current facility-administered medications for this encounter.  Review of Systems:  Review of Systems  Constitutional: Negative for fever, chills, weight loss, malaise/fatigue and diaphoresis.  HENT: Negative for hearing loss, ear pain, nosebleeds, congestion, sore throat, neck pain, tinnitus and ear  discharge.  Eyes: Negative for blurred vision, double vision, photophobia, pain, discharge and redness.  Respiratory: Negative for cough, hemoptysis, sputum production, shortness of breath, wheezing and stridor.  Cardiovascular: Negative for chest pain, palpitations, orthopnea, claudication, leg swelling and PND.  Gastrointestinal: Positive for abdominal pain. Negative for heartburn, nausea, vomiting, diarrhea, constipation, blood in stool and melena.  Genitourinary: Negative for dysuria, urgency, frequency, hematuria and flank pain.  Musculoskeletal: Negative for myalgias, back pain, joint pain and falls.  Skin: Negative for itching and rash.  Neurological: Negative for dizziness, tingling, tremors, sensory change, speech change, focal weakness, seizures, loss of consciousness, weakness and headaches.  Endo/Heme/Allergies: Negative for environmental allergies and polydipsia. Does not bruise/bleed easily.  Psychiatric/Behavioral: Negative for depression, suicidal ideas, hallucinations, memory loss and substance abuse. The patient is not nervous/anxious and does not have insomnia.  PHYSICAL EXAM:  Blood pressure (!) 151/100, pulse (!) 106, temperature 98.6 F (37 C), temperature source Oral, resp. rate 16, height 5\' 5"  (1.651 m), weight 163 lb (73.9 kg), last menstrual period 12/17/2016, SpO2 95 %.  Vitals reviewed.  Constitutional: She is oriented to person, place, and time. She appears well-developed  and well-nourished.  HENT:  Head: Normocephalic and atraumatic.  Right Ear: External ear normal.  Left Ear: External ear normal.  Nose: Nose normal.  Mouth/Throat: Oropharynx is clear and moist.  Eyes: Conjunctivae and EOM are normal. Pupils are equal, round, and reactive to light. Right eye exhibits no discharge. Left eye exhibits no discharge. No scleral icterus.  Neck: Normal range of motion. Neck supple. No tracheal deviation present. No thyromegaly present.  Cardiovascular: Normal rate,  regular rhythm, normal heart sounds and intact distal pulses. Exam reveals no gallop and no friction rub.  No murmur heard.  Respiratory: Effort normal and breath sounds normal. No respiratory distress. She has no wheezes. She has no rales. She exhibits no tenderness.  GI: Soft. Bowel sounds are normal. She exhibits no distension and no mass. There is tenderness. There is no rebound and no guarding.  Genitourinary:  Vulva is normal without lesions Vagina is pink moist without discharge, Grade II cystocoel Cervix normal in appearance and pap is normal Uterus is Grade IV uterine [prolapse  Grade III rectocoele Adnexa is negative with normal sized ovaries by sonogram  Musculoskeletal: Normal range of motion. She exhibits no edema and no tenderness.  Neurological: She is alert and oriented to person, place, and time. She has normal reflexes. She displays normal reflexes. No cranial nerve deficit. She exhibits normal muscle tone. Coordination normal.  Skin: Skin is warm and dry. No rash noted. No erythema. No pallor.  Psychiatric: She has a normal mood and affect. Her behavior is normal. Judgment and thought content normal.  Labs:        Results for orders placed or performed during the hospital encounter of 12/26/16 (from the past 336 hour(s))  Surgical pcr screen   Collection Time: 12/26/16 3:02 PM  Result Value Ref Range   MRSA, PCR NEGATIVE NEGATIVE   Staphylococcus aureus NEGATIVE NEGATIVE  CBC   Collection Time: 12/26/16 3:02 PM  Result Value Ref Range   WBC 8.0 4.0 - 10.5 K/uL   RBC 4.83 3.87 - 5.11 MIL/uL   Hemoglobin 14.4 12.0 - 15.0 g/dL   HCT 16.1 09.6 - 04.5 %   MCV 87.8 78.0 - 100.0 fL   MCH 29.8 26.0 - 34.0 pg   MCHC 34.0 30.0 - 36.0 g/dL   RDW 40.9 81.1 - 91.4 %   Platelets 308 150 - 400 K/uL  Comprehensive metabolic panel   Collection Time: 12/26/16 3:02 PM  Result Value Ref Range   Sodium 136 135 - 145 mmol/L   Potassium 3.9 3.5 - 5.1 mmol/L   Chloride 100 (L) 101  - 111 mmol/L   CO2 24 22 - 32 mmol/L   Glucose, Bld 82 65 - 99 mg/dL   BUN 16 6 - 20 mg/dL   Creatinine, Ser 7.82 0.44 - 1.00 mg/dL   Calcium 9.3 8.9 - 95.6 mg/dL   Total Protein 7.9 6.5 - 8.1 g/dL   Albumin 4.3 3.5 - 5.0 g/dL   AST 17 15 - 41 U/L   ALT 14 14 - 54 U/L   Alkaline Phosphatase 85 38 - 126 U/L   Total Bilirubin 0.6 0.3 - 1.2 mg/dL   GFR calc non Af Amer >60 >60 mL/min   GFR calc Af Amer >60 >60 mL/min   Anion gap 12 5 - 15  hCG, quantitative, pregnancy   Collection Time: 12/26/16 3:02 PM  Result Value Ref Range   hCG, Beta Chain, Quant, S <1 <5 mIU/mL  Rapid HIV screen (  HIV 1/2 Ab+Ag)   Collection Time: 12/26/16 3:02 PM  Result Value Ref Range   HIV-1 P24 Antigen - HIV24 NON REACTIVE NON REACTIVE   HIV 1/2 Antibodies NON REACTIVE NON REACTIVE   Interpretation (HIV Ag Ab)      A non reactive test result means that HIV 1 or HIV 2 antibodies and HIV 1 p24 antigen were not detected in the specimen.  Urinalysis, Routine w reflex microscopic   Collection Time: 12/26/16 3:02 PM  Result Value Ref Range   Color, Urine YELLOW YELLOW   APPearance CLEAR CLEAR   Specific Gravity, Urine 1.016 1.005 - 1.030   pH 6.0 5.0 - 8.0   Glucose, UA NEGATIVE NEGATIVE mg/dL   Hgb urine dipstick LARGE (A) NEGATIVE   Bilirubin Urine NEGATIVE NEGATIVE   Ketones, ur NEGATIVE NEGATIVE mg/dL   Protein, ur NEGATIVE NEGATIVE mg/dL   Nitrite NEGATIVE NEGATIVE   Leukocytes, UA NEGATIVE NEGATIVE   RBC / HPF 0-5 0 - 5 RBC/hpf   WBC, UA 0-5 0 - 5 WBC/hpf   Bacteria, UA NONE SEEN NONE SEEN   Squamous Epithelial / LPF NONE SEEN NONE SEEN  Type and screen   Collection Time: 12/26/16 3:02 PM  Result Value Ref Range   ABO/RH(D) B POS    Antibody Screen NEG    Sample Expiration 12/29/2016    Extend sample reason NO TRANSFUSIONS OR PREGNANCY IN THE PAST 3 MONTHS   Results for orders placed or performed in visit on 12/18/16 (from the past 336 hour(s))  Lipid panel   Collection Time: 12/18/16 4:54  PM  Result Value Ref Range   Cholesterol, Total 242 (H) 100 - 199 mg/dL   Triglycerides 161 0 - 149 mg/dL   HDL 59 >09 mg/dL   VLDL Cholesterol Cal 27 5 - 40 mg/dL   LDL Calculated 604 (H) 0 - 99 mg/dL   Chol/HDL Ratio 4.1 0.0 - 4.4 ratio  CBC   Collection Time: 12/18/16 4:54 PM  Result Value Ref Range   WBC 7.9 3.4 - 10.8 x10E3/uL   RBC 4.61 3.77 - 5.28 x10E6/uL   Hemoglobin 13.7 11.1 - 15.9 g/dL   Hematocrit 54.0 98.1 - 46.6 %   MCV 88 79 - 97 fL   MCH 29.7 26.6 - 33.0 pg   MCHC 33.7 31.5 - 35.7 g/dL   RDW 19.1 47.8 - 29.5 %   Platelets 313 150 - 379 x10E3/uL  Basic metabolic panel   Collection Time: 12/18/16 4:54 PM  Result Value Ref Range   Glucose 83 65 - 99 mg/dL   BUN 13 6 - 24 mg/dL   Creatinine, Ser 6.21 0.57 - 1.00 mg/dL   GFR calc non Af Amer 107 >59 mL/min/1.73   GFR calc Af Amer 123 >59 mL/min/1.73   BUN/Creatinine Ratio 21 9 - 23   Sodium 141 134 - 144 mmol/L   Potassium 4.2 3.5 - 5.2 mmol/L   Chloride 101 96 - 106 mmol/L   CO2 24 20 - 29 mmol/L   Calcium 9.1 8.7 - 10.2 mg/dL   EKG:     Orders placed or performed during the hospital encounter of 12/26/16  . EKG 12-Lead  . EKG 12-Lead   Imaging Studies:  None  Assessment:  Grade IV uterine prolapse  Grade II cystocoele  Grade III rectocoele  Plan:  TVH BSO anterior and posterior colporrhaphy  Pt understands the risks of surgery including but not limited t excessive bleeding requiring transfusion or reoperation, post-operative  infection requiring prolonged hospitalization or re-hospitalization and antibiotic therapy, and damage to other organs including bladder, bowel, ureters and major vessels. The patient also understands the alternative treatment options which were discussed in full. All questions were answered.       Face to face time:  25 minutes  Greater than 50% of the visit time was spent in counseling and coordination of care with the patient.  The summary and outline of the counseling and  care coordination is summarized in the note above.   All questions were answered.

## 2017-04-07 ENCOUNTER — Telehealth: Payer: Self-pay | Admitting: Internal Medicine

## 2017-04-07 NOTE — Telephone Encounter (Signed)
Pt called and said she use to get her rescue inhalers free, but now that she has her own insurance she can no longer get the free ones and now she needs a prescription to get a rescue inhaler from her pharmacy. Her pharmacy is the [facility] on [address]. She also said she needs a new nebulizer tube and that they no longer give them over the counter anymore and that she needs a prescription to get a new tube. Please advise

## 2017-04-08 MED ORDER — ALBUTEROL SULFATE HFA 108 (90 BASE) MCG/ACT IN AERS: 1.0000 | INHALATION_SPRAY | Freq: Four times a day (QID) | RESPIRATORY_TRACT | 1 refills | Status: DC | PRN

## 2017-04-08 NOTE — Telephone Encounter (Signed)
Pt informed. Marie Webb, CMA  

## 2017-04-08 NOTE — Telephone Encounter (Signed)
Sent in prescription for albuterol rescue inhaler. Patient will need DME order for nebulizer tube. Will leave for patient at front desk.   Tarri AbernethyAbigail J Timaya Bojarski, MD, MPH PGY-3 [facility] Pager [phone]

## 2017-04-09 ENCOUNTER — Ambulatory Visit: Payer: 59 | Admitting: Obstetrics & Gynecology

## 2017-04-29 ENCOUNTER — Encounter: Payer: Self-pay | Admitting: Family Medicine

## 2017-04-29 ENCOUNTER — Other Ambulatory Visit: Payer: Self-pay

## 2017-04-29 ENCOUNTER — Encounter: Payer: Self-pay | Admitting: Internal Medicine

## 2017-04-29 ENCOUNTER — Ambulatory Visit (INDEPENDENT_AMBULATORY_CARE_PROVIDER_SITE_OTHER): Payer: 59 | Admitting: Family Medicine

## 2017-04-29 ENCOUNTER — Ambulatory Visit (INDEPENDENT_AMBULATORY_CARE_PROVIDER_SITE_OTHER): Payer: 59 | Admitting: Internal Medicine

## 2017-04-29 VITALS — BP 130/96 | Ht 64.0 in | Wt 155.0 lb

## 2017-04-29 DIAGNOSIS — F329 Major depressive disorder, single episode, unspecified: Secondary | ICD-10-CM | POA: Diagnosis not present

## 2017-04-29 DIAGNOSIS — M5412 Radiculopathy, cervical region: Secondary | ICD-10-CM

## 2017-04-29 DIAGNOSIS — F32A Depression, unspecified: Secondary | ICD-10-CM

## 2017-04-29 MED ORDER — VENLAFAXINE HCL ER 37.5 MG PO CP24: 37.5000 mg | ORAL_CAPSULE | Freq: Every day | ORAL | 1 refills | Status: DC

## 2017-04-29 MED ORDER — NAPROXEN 500 MG PO TABS: 500.0000 mg | ORAL_TABLET | Freq: Two times a day (BID) | ORAL | 2 refills | Status: AC | PRN

## 2017-04-29 NOTE — Patient Instructions (Addendum)
It was nice seeing you again today Marie Webb!  We have made changes to your medication today. Please follow these instructions: - Decrease your next dose of amitriptyline to 50mg . Take 50mg  for 3 days. After that, decrease your dose to 25mg . Take 25mg  for 3 days. After that, cut the pill in half and decrease the dose to 12.5mg . Take 12.5mg  for 3 days, then discontinue the medication entirely.  - Begin taking the new medication Effexor (venlafaxine) tomorrow morning with breakfast. You will take one tablet daily. Take this medication with the amitriptyline while you are cutting down on the dose.   If your symptoms are worsening or your notice any new side effects, please call to let me know.   If you have any questions or concerns, please feel free to call the clinic.   Be well,  Dr. Natale MilchLancaster

## 2017-04-29 NOTE — Progress Notes (Signed)
Subjective:   Patient: Marie Webb       Birthdate: [birth year]       MRN: [id number]      HPI  Marie Webb is a 46 y.o. female presenting for depression.   Depression Patient presents today wishing to change or discontinue anti-depressants. Has been taking amitriptyline which was increased from 50mg  to 75mg  at last visit on 09/25 as patient's symptoms were not well-controlled. Patient says that increased dose of amitriptyline made her extremely fatigued and worsened her depressive symptoms. She took 75mg  for one week, then stopped due to the symptoms. She then tried another week of the increased dose after taking a small break, but experienced the same symptoms, so again decreased to previous dose of 50mg .  Patient is wondering if she needs to continue medicine as she is only having symptoms about two days per week. Says on these days, she feels so tired that she does not even want to get out of bed int he morning. Also reports crying an excessive amount on those days and being very irritable, typically causing her to lash out of her husband. Multiple family members have noticed her mood changes on these days and have asked her to be seen for this issue. She also notes anxiety and feeling of being overwhelmed rather frequently. Denies SI/HI or thoughts that she would be better of dead. Thinks that in general her symptoms are getting worse as she gets older, and are also typically worse around the holidays. No new life stressors since last visit. Says she is in a happy marriage and has four good kids, although she is having some issues with one of her children. She uses prayer and distractions, such as taking a walk with her husband, to try to help with symptoms some days, though this is not always helpful. Has been to counseling in the past, but said she wasn't sure if this was helpful or not. Would be open to attending counseling again.   Of note, amitriptyline had also been helpful in  controlling her migraines. Patient says she has only had two migraines since August.   Smoking status reviewed. Patient is never smoker.   Review of Systems See HPI.     Objective:  Physical Exam  Constitutional: She is oriented to person, place, and time.  Pleasant female in NAD  HENT:  Head: Normocephalic and atraumatic.  Pulmonary/Chest: Effort normal. No respiratory distress.  Neurological: She is alert and oriented to person, place, and time.  Skin: Skin is warm and dry.  Psychiatric:  Does not make eye contact frequently, however is legally blind. Pressured speech at times.    Assessment & Plan:  Depression No improvement and actually worsening of symptoms with increased dose of amitriptyline. PHQ9 score actually slightly decreased today (8 at today's visit, 9 at last visit), however patient thinks her symptoms are possibly worsening and likely will continue to worsen over the holidays. Patient initially unsure if she wanted to continue medication, however as conversation progressed, seemed more certain that this is the course of action she would like to take. As patient having seemingly significant symptoms at least twice weekly, feel that continuing medication is warranted. Patient also agreeable to counseling as well. As symptoms not well-controlled with amitriptyline, will change to venlafaxine, as venlafaxine also with some use for migraine prophylaxis. No SI/HI today, and patient with good support systems in place.  - Begin amitriptyline taper: decrease from 50mg  to 25mg  today x3d, then  cut in half to 12.5mg  x3d, then discontinue - Begin treatment with venlafaxine 37.5mg  today (purposefully overlapping with amitriptyline to reduce possible withdrawal symptoms) - Provided patient with Dr. Carola RhineKane's phone number and encouraged her to call [facility] - F/u in 4-6 weeks   Tarri AbernethyAbigail J Sherrelle Prochazka, MD, MPH PGY-3 [facility] Pager [phone]

## 2017-04-29 NOTE — Progress Notes (Signed)
HPI  CC: Bilateral 4th/5th digit numbness  She had been having this numbness and tingling that radiates down the back of the arm and elbow into the 4th/5th digits for the past couple of years. She especially noticed it get worse over the past year and when trying to pick up groceries. She has been having intermittent neck stiffness as well. She notices the numbness is worst at night when she lays on the affected side. She denies any weakness in her grip or arm muscles.   Traumatic: No  Location: 4th/5th digit palmer and dorsum, R>L  Quality: Paresthesias (type of pain)  Duration: 1-10 minutes for the past 1-2 years Timing: throughout the day but especially when sleeping on side, or laying on belly and playing with grandchildren Improving/Worsening: Worsening and becoming more bothersome over the past year  Makes better: shaking her arm/hand out Makes worse: Prolonged pressure  Associated symptoms: None  Previous Interventions Tried: chiropractor   Past Injuries: None Past Surgeries: hysterectomy  Smoking: None  Family Hx: Noncontributory   ROS: Per HPI; in addition no fever, no rash, no additional weakness, no additional numbness, no additional paresthesias, and no additional falls/injury.   Objective: BP (!) 130/96   Ht 5\' 4"  (1.626 m)   Wt 155 lb (70.3 kg)   LMP 12/17/2016 (Approximate)   BMI 26.61 kg/m  Gen: Left-Hand Dominant. NAD, well groomed, a/o x3, normal affect.  CV: Well-perfused. Warm.  Resp: Non-labored.  Neuro: Sensation intact throughout. No gross coordination deficits. 3+ patellar reflexes b/l, 3+ brachioradialis reflexes b/l, 2+ biceps reflexes b/l, 2+ triceps reflexes b/l MSK: No tenderness to palpation of both hands, shoulders or along spine. Noted deformity noted over the back with poor posture. Hands and wrists appear normal, no swelling, atrophy, erythema, or ecchymosis. Strength in hand grip 5/5 b/l, biceps 5/5 b/l, deltoid 4/5 b/l, wrist  extension/flexion 5/5 b/l, interossei muscles 4+/5 R, 5/5L, thumb adduction good with good strength. Full ROM in shoulders and wrists. Phalen's positive to produce pain in the 4th/5th digit of the right hand, Tineal's sign negative. Obrien's positive with pain with right side. Spurlings test produces pain in the neck only b/l, no radiation down the arm (negative).  Assessment and Plan:  Cervical radiculopathy Chronic. Likely C5-C6 roots (ulnar nerve). Will get XR first and subsequent MRI if indicated and patient agreed. Unlikely to be carpal tunnel or a wrist pathology given pain radiation. Patient already on Elavil 75mg  PO QHS.  -Cervical XR -Naproxen BID PRN -amb ref to PT -Will contact w/ XR results to discuss possible MRI  Next: Consider future MRI, and/or referral for epidural inj if Sxs persist/worsen.  Orders Placed This Encounter  Procedures  . DG Cervical Spine 2 or 3 views    Standing Status:   Future    Standing Expiration Date:   06/30/2018    Order Specific Question:   Reason for Exam (SYMPTOM  OR DIAGNOSIS REQUIRED)    Answer:   cervical radiculopathy; AP, lateral and olique x2 views    Order Specific Question:   Is patient pregnant?    Answer:   No    Order Specific Question:   Preferred imaging location?    Answer:   [facility]    Order Specific Question:   Radiology Contrast Protocol - do NOT remove file path    Answer:   [email or web address]  . Ambulatory referral to Physical Therapy    Referral Priority:   Routine    Referral Type:  Physical Medicine    Referral Reason:   Specialty Services Required    Requested Specialty:   Physical Therapy    Number of Visits Requested:   1    Meds ordered this encounter  Medications  . naproxen (NAPROSYN) 500 MG tablet    Sig: Take 1 tablet (500 mg total) by mouth 2 (two) times daily as needed.    Dispense:  60 tablet    Refill:  2    Renford DillsJanie Gibson, MD, Family Medicine  Resident 04/29/2017 2:55 PM   Attending/Fellow Addendum:  I have discussed this patient's visit at length and in detail with Dr. Hollice EspyGibson. I have examined the patient personally and agree with the documented physical exam, assessment, and resulting plan. I discussed the treatment plan personally w/ the patient.   Kathee DeltonIan D McKeag, MD,MS [facility] Fellow 04/29/2017 5:52 PM

## 2017-04-29 NOTE — Assessment & Plan Note (Addendum)
Chronic. Likely C5-C6 roots (ulnar nerve). Will get XR first and subsequent MRI if indicated and patient agreed. Unlikely to be carpal tunnel or a wrist pathology given pain radiation. Patient already on Elavil 75mg  PO QHS.  -Cervical XR -Naproxen BID PRN -amb ref to PT -Will contact w/ XR results to discuss possible MRI  Next: Consider future MRI, and/or referral for epidural inj if Sxs persist/worsen.

## 2017-04-29 NOTE — Assessment & Plan Note (Signed)
No improvement and actually worsening of symptoms with increased dose of amitriptyline. PHQ9 score actually slightly decreased today (8 at today's visit, 9 at last visit), however patient thinks her symptoms are possibly worsening and likely will continue to worsen over the holidays. Patient initially unsure if she wanted to continue medication, however as conversation progressed, seemed more certain that this is the course of action she would like to take. As patient having seemingly significant symptoms at least twice weekly, feel that continuing medication is warranted. Patient also agreeable to counseling as well. As symptoms not well-controlled with amitriptyline, will change to venlafaxine, as venlafaxine also with some use for migraine prophylaxis. No SI/HI today, and patient with good support systems in place.  - Begin amitriptyline taper: decrease from 50mg  to 25mg  today x3d, then cut in half to 12.5mg  x3d, then discontinue - Begin treatment with venlafaxine 37.5mg  today (purposefully overlapping with amitriptyline to reduce possible withdrawal symptoms) - Provided patient with Dr. Carola RhineKane's phone number and encouraged [facility] to [facility] - F/u in 4-6 weeks

## 2017-05-01 ENCOUNTER — Ambulatory Visit (HOSPITAL_COMMUNITY)
Admission: RE | Admit: 2017-05-01 | Discharge: 2017-05-01 | Disposition: A | Discharge status: Home / Self Care | Payer: 59 | Source: Ambulatory Visit | Attending: Family Medicine | Admitting: Family Medicine

## 2017-05-01 DIAGNOSIS — M5412 Radiculopathy, cervical region: Secondary | ICD-10-CM | POA: Diagnosis not present

## 2017-05-01 DIAGNOSIS — M542 Cervicalgia: Secondary | ICD-10-CM | POA: Diagnosis not present

## 2017-05-05 ENCOUNTER — Telehealth: Payer: Self-pay | Admitting: Family Medicine

## 2017-05-05 NOTE — Telephone Encounter (Addendum)
Telephone Note: Called patient to discuss C-spine XR. Family answered, informed me that patient would be away for another 30 minutes or so and I could call back at that time.  Kathee DeltonIan D Adella Manolis, MD,MS [facility] Fellow 05/05/2017 2:37 PM    Telephone Note: Called patient again. Patient now home. Discussed XR of c-spine w/ patient, spent a significant amount of time talking about the reassuring qualities seen on XR as there is very little evidence of disc space loss.   Encouraged moving forward w/ PT (Rx given during office visit). Advised to allow ~4 weeks to let PT help. Patient can be seen if Sxs worsen. In 4-6 weeks, patient to call office w/ update. If Sxs not improving, consider Nerve Conduction Study vs. MRI  Kathee DeltonIan D Perl Kerney, MD,MS [facility] Fellow 05/05/2017 5:04 PM

## 2017-05-20 ENCOUNTER — Telehealth: Payer: Self-pay | Admitting: *Deleted

## 2017-05-20 NOTE — Telephone Encounter (Signed)
Patient left message on nurse line requesting to return to amitriptyline 50 mg q HS. States she's been on Effexor 37.5 mg X 3 weeks and it makes her feel "terrible." Has jitteryness, headaches and increased BP since one week after starting Effexor. Needs to know how to safely stop taking Effexor. Please call her at [phone]. Kinnie FeilL. Ducatte, RN, BSN

## 2017-05-22 NOTE — Telephone Encounter (Signed)
Called patient regarding Effexor. Currently on lowest dose for 3 weeks. As such, taper not indicated. Informed patient that she can discontinue Effexor today, and resume amitriptyline if she would like. Encouraged to call with any questions.   Tarri AbernethyAbigail J Arali Somera, MD, MPH PGY-3 [facility] Pager [phone]

## 2017-06-06 ENCOUNTER — Other Ambulatory Visit: Payer: Self-pay | Admitting: Family Medicine

## 2017-06-08 NOTE — Telephone Encounter (Signed)
Will forward to PCP  Bacigalupo, Angela M, MD, MPH [facility] 06/08/2017 8:27 AM

## 2017-06-10 ENCOUNTER — Telehealth: Payer: Self-pay | Admitting: Internal Medicine

## 2017-06-10 NOTE — Telephone Encounter (Signed)
Pt needs a nebulizer. Her machine is old and doesn't work well. Please Fax RX to [facility]  [phone]

## 2017-06-10 NOTE — Telephone Encounter (Signed)
apria told pt a separate Rx is needed for the tube for the nebulizer.

## 2017-06-11 NOTE — Telephone Encounter (Signed)
DME prescription for both nebulizer and nebulizer tube written and placed in pile to be faxed to [facility] at number provided.   Marie AbernethyAbigail J Dayelin Balducci, MD, MPH PGY-3 [facility] Pager [phone]

## 2017-06-15 ENCOUNTER — Other Ambulatory Visit: Payer: Self-pay | Admitting: Internal Medicine

## 2017-06-15 ENCOUNTER — Other Ambulatory Visit: Payer: Self-pay | Admitting: Family Medicine

## 2017-06-15 NOTE — Telephone Encounter (Signed)
Will forward to PCP  Alfreddie Consalvo, Marzella SchleinAngela M, MD, MPH [facility] 06/15/2017 3:04 PM

## 2017-06-18 DIAGNOSIS — R062 Wheezing: Secondary | ICD-10-CM | POA: Diagnosis not present

## 2017-06-18 DIAGNOSIS — R0602 Shortness of breath: Secondary | ICD-10-CM | POA: Diagnosis not present

## 2017-06-22 ENCOUNTER — Ambulatory Visit: Payer: 59 | Admitting: Obstetrics & Gynecology

## 2017-06-22 ENCOUNTER — Encounter: Payer: Self-pay | Admitting: Obstetrics & Gynecology

## 2017-06-22 ENCOUNTER — Ambulatory Visit (INDEPENDENT_AMBULATORY_CARE_PROVIDER_SITE_OTHER): Payer: 59 | Admitting: Obstetrics & Gynecology

## 2017-06-22 VITALS — BP 148/100 | HR 110 | Ht 64.0 in | Wt 166.5 lb

## 2017-06-22 DIAGNOSIS — N816 Rectocele: Secondary | ICD-10-CM

## 2017-06-22 DIAGNOSIS — N8110 Cystocele, unspecified: Secondary | ICD-10-CM | POA: Diagnosis not present

## 2017-06-22 NOTE — Progress Notes (Signed)
Chief Complaint  Patient presents with  . feels like something has dropped    pain in right side      47 y.o. [id number] Patient's last menstrual period was 12/17/2016 (approximate). The current method of family planning is status post hysterectomy.  Outpatient Encounter Medications as of 06/22/2017  Medication Sig  . albuterol (PROVENTIL) (2.5 MG/3ML) 0.083% nebulizer solution USE ONE VIAL IN NEBULIZER EVERY 6 HOURS AS NEEDED FOR  WHEEZING  OR  SHORTNESS  OF  BREATH  . amitriptyline (ELAVIL) 25 MG tablet TAKE TWO TABLETS BY MOUTH AT BEDTIME (Patient taking differently: TAKE ONE TABLET BY MOUTH AT BEDTIME)  . amLODipine (NORVASC) 5 MG tablet Take 1 tablet (5 mg total) by mouth daily.  Marland Kitchen. BREO ELLIPTA 200-25 MCG/INH AEPB INHALE ONE PUFF BY MOUTH (INTO THE LUNGS) DAILY  . conjugated estrogens (PREMARIN) vaginal cream 1 gram nightly  . naproxen (NAPROSYN) 500 MG tablet Take 1 tablet (500 mg total) by mouth 2 (two) times daily as needed.  . Omega-3 Fatty Acids (FISH OIL ULTRA) 1400 MG CAPS Take 1,400 mg by mouth daily.  Marland Kitchen. Respiratory Therapy Supplies (NEBULIZER AIR TUBE/PLUGS) MISC Nebulizer tubing  . SUMAtriptan (IMITREX) 50 MG tablet Take 1 tablet (50 mg total) by mouth every 2 (two) hours as needed for migraine. May repeat in 2 hours if headache persists or recurs.  . [DISCONTINUED] aspirin-acetaminophen-caffeine (EXCEDRIN MIGRAINE) 250-250-65 MG per tablet Take 2 tablets by mouth every 8 (eight) hours as needed for headache or migraine.   . [DISCONTINUED] VENTOLIN HFA 108 (90 Base) MCG/ACT inhaler INHALE 1 TO 2 PUFFS BY MOUTH EVERY 6 HOURS AS NEEDED FOR WHEEZING OR SHORTNESS OF BREATH  . [DISCONTINUED] amLODipine (NORVASC) 5 MG tablet TAKE ONE TABLET BY MOUTH ONCE DAILY  . [DISCONTINUED] venlafaxine XR (EFFEXOR XR) 37.5 MG 24 hr capsule Take 1 capsule (37.5 mg total) by mouth daily with breakfast.   No facility-administered encounter medications on file as of 06/22/2017.      Subjective Marie Webb  Is well-known to me She had a vaginal hysterectomy August 2018 for significant uterine prolapse She also had a cystocele rectocele which improved significantly doing a vaginal apex suspension At that time I made the decision not to proceed with an anterior posterior repair Patient was very reticent to have it done preoperatively and knows that its the type of thing that will probably have to be done several times in her life if we do it too early She comes in the day wondering if anything his change she feeling more pressure intercourse has been fine she says she feels it more on the right side She has been trying to avoid lifting but of course cannot do that completely She has had no bleeding no abnormal vaginal discharge and she continues to use her vaginal estrogen There is no real pain just a pressure feeling and has been going on now for the last month or so Past Medical History:  Diagnosis Date  . Asthma   . Complication of anesthesia   . Depression   . Headache   . Hypertension   . Legally blind   . Night terrors, adult   . PONV (postoperative nausea and vomiting)     Past Surgical History:  Procedure Laterality Date  . ABDOMINAL HYSTERECTOMY    . ENDOMETRIAL ABLATION    . VAGINAL HYSTERECTOMY N/A 12/31/2016   Procedure: VAGINAL HYSTERECTOMY  WITH VAGINAL APEX SUSPENSION;  Surgeon: Lazaro ArmsEure, Luther H, MD;  Location: [facility];  Service: Gynecology;  Laterality: N/A;    OB History    Gravida  5   Para  4   Term  4   Preterm      AB  1   Living  4     SAB  1   TAB      Ectopic      Multiple      Live Births  4           Allergies  Allergen Reactions  . Stadol [Butorphanol] Other (See Comments)    Anxiety/tremors  . Advair Diskus [Fluticasone-Salmeterol] Other (See Comments)    Headache  . Sertraline Hcl Other (See Comments)    Worsened Headaches (failed to reduce headaches)    Social History   Socioeconomic  History  . Marital status: Married    Spouse name: Not on file  . Number of children: Not on file  . Years of education: Not on file  . Highest education level: Not on file  Occupational History  . Not on file  Social Needs  . Financial resource strain: Not on file  . Food insecurity:    Worry: Not on file    Inability: Not on file  . Transportation needs:    Medical: Not on file    Non-medical: Not on file  Tobacco Use  . Smoking status: Never Smoker  . Smokeless tobacco: Never Used  Substance and Sexual Activity  . Alcohol use: Yes    Comment: rare  . Drug use: No  . Sexual activity: Not on file    Comment: hyst, intercourse age 71, less than 5 sexual partners, des neg  Lifestyle  . Physical activity:    Days per week: Not on file    Minutes per session: Not on file  . Stress: Not on file  Relationships  . Social connections:    Talks on phone: Not on file    Gets together: Not on file    Attends religious service: Not on file    Active member of club or organization: Not on file    Attends meetings of clubs or organizations: Not on file    Relationship status: Not on file  Other Topics Concern  . Not on file  Social History Narrative  . Not on file    Family History  Problem Relation Age of Onset  . Colon cancer Maternal Grandmother   . Colon cancer Mother   . Depression Mother   . Alcohol abuse Maternal Grandfather   . Cancer Brother   . Other Sister        killed in MVA  . Alcohol abuse Brother   . Asthma Son     Medications:       Current Outpatient Medications:  .  albuterol (PROVENTIL) (2.5 MG/3ML) 0.083% nebulizer solution, USE ONE VIAL IN NEBULIZER EVERY 6 HOURS AS NEEDED FOR  WHEEZING  OR  SHORTNESS  OF  BREATH, Disp: 75 mL, Rfl: 12 .  amitriptyline (ELAVIL) 25 MG tablet, TAKE TWO TABLETS BY MOUTH AT BEDTIME (Patient taking differently: TAKE ONE TABLET BY MOUTH AT BEDTIME), Disp: 180 tablet, Rfl: 3 .  amLODipine (NORVASC) 5 MG tablet, Take 1  tablet (5 mg total) by mouth daily., Disp: 90 tablet, Rfl: 3 .  BREO ELLIPTA 200-25 MCG/INH AEPB, INHALE ONE PUFF BY MOUTH (INTO THE LUNGS) DAILY, Disp: 60 each, Rfl: 11 .  conjugated estrogens (PREMARIN) vaginal cream, 1 gram nightly, Disp: 30  g, Rfl: 12 .  naproxen (NAPROSYN) 500 MG tablet, Take 1 tablet (500 mg total) by mouth 2 (two) times daily as needed., Disp: 60 tablet, Rfl: 2 .  Omega-3 Fatty Acids (FISH OIL ULTRA) 1400 MG CAPS, Take 1,400 mg by mouth daily., Disp: , Rfl:  .  Respiratory Therapy Supplies (NEBULIZER AIR TUBE/PLUGS) MISC, Nebulizer tubing, Disp: 1 each, Rfl: 2 .  SUMAtriptan (IMITREX) 50 MG tablet, Take 1 tablet (50 mg total) by mouth every 2 (two) hours as needed for migraine. May repeat in 2 hours if headache persists or recurs., Disp: 10 tablet, Rfl: 6 .  albuterol (VENTOLIN HFA) 108 (90 Base) MCG/ACT inhaler, INHALE 1 TO 2 PUFFS BY MOUTH EVERY 6 HOURS AS NEEDED FOR WHEEZING OR SHORTNESS OF BREATH, Disp: 18 each, Rfl: 1 .  doxycycline (VIBRAMYCIN) 100 MG capsule, Take 1 capsule (100 mg total) by mouth 2 (two) times daily., Disp: 20 capsule, Rfl: 0 .  mupirocin ointment (BACTROBAN) 2 %, Apply 1 application topically 2 (two) times daily., Disp: 22 g, Rfl: 0  Objective Blood pressure (!) 148/100, pulse (!) 110, height 5\' 4"  (1.626 m), weight 166 lb 8 oz (75.5 kg), last menstrual period 12/17/2016.  General WDWN female NAD Vulva:  normal appearing vulva with no masses, tenderness or lesions Vagina: Grade 2 cystocele grade 3 rectocele stable Cervix:  absent Uterus:  uterus absent Adnexa: ovaries:present,  normal adnexa in size, nontender and no masses   Pertinent ROS No burning with urination, frequency or urgency No nausea, vomiting or diarrhea Nor fever chills or other constitutional symptoms   Labs or studies     Impression Diagnoses this Encounter::   ICD-10-CM   1. POP-Q stage 2 cystocele N81.10   2. POP-Q stage 3 rectocele N81.6     Established  relevant diagnosis(es):   Plan/Recommendations: No orders of the defined types were placed in this encounter.   Labs or Scans Ordered: No orders of the defined types were placed in this encounter.   Management:: Continue with the vaginal estrogen Avoid constipation and significant straining to delay the need for anterior and posterior colporrhaphy  Follow up Return for keep scheduled.     All questions were answered.

## 2017-06-23 ENCOUNTER — Telehealth: Payer: Self-pay

## 2017-06-23 NOTE — Telephone Encounter (Signed)
Patient left message on nurse line asking if BP occasionally in the 130s/70s ok. Called back and spoke with patient and BP is normally in 120s but sometimes 130s. Told patient that is still a good BP but patient is overdue for BP f/u and also depression f/u. Made appt for 07/01/17 with PCP. Ples SpecterAlisa Brake, RN [facility] RN)

## 2017-06-24 ENCOUNTER — Telehealth: Payer: Self-pay | Admitting: Internal Medicine

## 2017-06-24 NOTE — Telephone Encounter (Signed)
Received form from [employer] ("initial prescription - letter of medical necessity"). Completed form. Will fax to [phone] (number listed on cover sheet).   Tarri AbernethyAbigail J Keymarion Bearman, MD, MPH PGY-3 [facility] Pager [phone]

## 2017-06-30 DIAGNOSIS — H359 Unspecified retinal disorder: Secondary | ICD-10-CM | POA: Diagnosis not present

## 2017-07-01 ENCOUNTER — Ambulatory Visit: Payer: 59 | Admitting: Internal Medicine

## 2017-07-15 ENCOUNTER — Ambulatory Visit: Payer: 59 | Admitting: Internal Medicine

## 2017-08-05 ENCOUNTER — Ambulatory Visit: Payer: 59 | Admitting: Internal Medicine

## 2017-08-20 ENCOUNTER — Other Ambulatory Visit: Payer: Self-pay

## 2017-08-20 MED ORDER — ALBUTEROL SULFATE HFA 108 (90 BASE) MCG/ACT IN AERS: INHALATION_SPRAY | RESPIRATORY_TRACT | 1 refills | Status: DC

## 2017-08-20 NOTE — Telephone Encounter (Signed)
Patient transferring pharmacy. Ples SpecterAlisa Brake, RN [facility] RN)

## 2017-10-06 ENCOUNTER — Ambulatory Visit: Payer: 59 | Admitting: Internal Medicine

## 2017-10-12 ENCOUNTER — Ambulatory Visit (INDEPENDENT_AMBULATORY_CARE_PROVIDER_SITE_OTHER): Payer: 59 | Admitting: Internal Medicine

## 2017-10-12 ENCOUNTER — Other Ambulatory Visit: Payer: Self-pay

## 2017-10-12 ENCOUNTER — Encounter: Payer: Self-pay | Admitting: Internal Medicine

## 2017-10-12 DIAGNOSIS — I10 Essential (primary) hypertension: Secondary | ICD-10-CM

## 2017-10-12 DIAGNOSIS — R200 Anesthesia of skin: Secondary | ICD-10-CM | POA: Diagnosis not present

## 2017-10-12 HISTORY — DX: Anesthesia of skin: R20.0

## 2017-10-12 NOTE — Assessment & Plan Note (Addendum)
Well-controlled. BP slightly elevated at 142/88 today, however reported home measurements at goal. Will continue amlodipine  for now. Obtain BMP at next physical in a couple months.

## 2017-10-12 NOTE — Progress Notes (Signed)
   Subjective:   Patient: Marie Webb       Birthdate: [birth year]       MRN: [id number]      HPI  Sabra CACEY WILLOW is a 47 y.o. female presenting for HTN f/u and R sided pain.   HTN Taking amlodipine . Measures BP at home and normally has readings in 120s-low 130s/60-70s. Has no concerns about this today, just wanted to make sure she didn't need any amlodipine dose adjustment.   R-hip/leg pain Only occurs at night after she has been laying on her R side for extended period of time. Occurs up to three times per night but does not occur every night. At the most, occurs about 5 times per week. If she rolls over onto there L side, the numbness resolves. Describes as numbness and discomfort, but not true pain. Says has not been bothersome enough for her to try to take a medication, as sx resolve when she rolls over. Ongoing for about a year. Just wanted to make sure that it was not an issue with her ovaries.   Smoking status reviewed. Patient is never smoker.   Review of Systems See HPI.     Objective:  Physical Exam  Constitutional: She is oriented to person, place, and time. She appears well-developed and well-nourished. No distress.  HENT:  Head: Normocephalic and atraumatic.  Pulmonary/Chest: Effort normal. No respiratory distress.  Musculoskeletal:  Able to walk, sit, and stand without assistance or difficulty. No TTP of lower back, R hip, R upper leg.   Neurological: She is alert and oriented to person, place, and time.  Psychiatric: She has a normal mood and affect. Her behavior is normal.     Assessment & Plan:  Essential hypertension Well-controlled. BP slightly elevated at 142/88 today, however reported home measurements at goal. Will continue amlodipine  for now. Obtain BMP at next physical in a couple months.   Right leg numbness Sensation begins in lower back on R and extends to R thigh. As only occurs when patient laying on that side, likely nerve compression,  possibly sciatic given distribution of numbness. No true pain per patient's report, and not wishing to begin medication today. Simply wanted to ensure issue was not something dangerous. Reassured her that this is less likely to be related to her ovaries given location and nature of sensation. As sensation not reproducible on exam and is numbness rather than pain, less likely MSK etiology. Encouraged to let us know if sx worsening.   Tarri Abernethy, MD, MPH PGY-3 [facility] Pager [phone]

## 2017-10-12 NOTE — Patient Instructions (Signed)
It was nice seeing you again today Marie Webb!  Please continue taking amlodipine as you have been.   You can try putting a pillow (possibly a wedge pillow) on your right side at night to prevent yourself from rolling over onto that side and causing numbness and discomfort. If your symptoms worsen, please let me know.   We will see you back for your next physical after July.   If you have any questions or concerns, please feel free to call the clinic.   Be well,  Dr. Natale Milch

## 2017-10-12 NOTE — Assessment & Plan Note (Signed)
Sensation begins in lower back on R and extends to R thigh. As only occurs when patient laying on that side, likely nerve compression, possibly sciatic given distribution of numbness. No true pain per patient's report, and not wishing to begin medication today. Simply wanted to ensure issue was not something dangerous. Reassured her that this is less likely to be related to her ovaries given location and nature of sensation. As sensation not reproducible on exam and is numbness rather than pain, less likely MSK etiology. Encouraged to let us know if sx worsening.

## 2017-10-26 ENCOUNTER — Ambulatory Visit: Payer: 59 | Admitting: Obstetrics & Gynecology

## 2017-10-29 ENCOUNTER — Encounter: Payer: Self-pay | Admitting: Obstetrics & Gynecology

## 2017-10-29 ENCOUNTER — Ambulatory Visit (INDEPENDENT_AMBULATORY_CARE_PROVIDER_SITE_OTHER): Payer: 59 | Admitting: Obstetrics & Gynecology

## 2017-10-29 VITALS — BP 134/82 | Temp 98.7°F | Ht 64.0 in | Wt 167.0 lb

## 2017-10-29 DIAGNOSIS — N816 Rectocele: Secondary | ICD-10-CM | POA: Diagnosis not present

## 2017-10-29 DIAGNOSIS — R5383 Other fatigue: Secondary | ICD-10-CM

## 2017-10-29 DIAGNOSIS — N951 Menopausal and female climacteric states: Secondary | ICD-10-CM

## 2017-10-29 DIAGNOSIS — N898 Other specified noninflammatory disorders of vagina: Secondary | ICD-10-CM

## 2017-10-29 LAB — WET PREP FOR TRICH, YEAST, CLUE

## 2017-10-29 MED ORDER — FLUCONAZOLE 150 MG PO TABS: 150.0000 mg | ORAL_TABLET | Freq: Every day | ORAL | 2 refills | Status: AC

## 2017-10-29 NOTE — Patient Instructions (Signed)
1. Baden-Walker grade 3 rectocele Grade 3/3 rectocele.  Patient is mildly symptomatic, prefer observation at this point.  Recommended to avoid pelvic pressure in particular avoiding to push for bowel movements.  Therefore important to treat constipation.  Avoid chronic coughing and heavy lifting.  Surgical correction of rectocele reviewed.  Will add information on pessary to the summary.  2. Vaginal discharge Yeast vaginitis present.  Diagnosis and management discussed.  Will treat with fluconazole 1 tablet per mouth daily for 3 days.  Prescription sent to pharmacy.  3. Fatigue, unspecified type Possibly perimenopausal, will check [facility] today.  Rule out thyroid dysfunction and anemia. - TSH - FSH - CBC  4. Perimenopause As above.  Patient is currently using Premarin cream vaginally.  Was using it daily, recommend to reduce applications to twice weekly.  No pain or dryness with intercourse currently. [facility]  Other orders - fluconazole (DIFLUCAN) 150 MG tablet; Take 1 tablet (150 mg total) by mouth daily for 3 days.  Jenia, it was a pleasure meeting you today!  I will inform you of your results as soon as they are available.  I added information below on rectocele.  We did not discuss the use of pessaries for prolapse.  Most of the time for a rectocele either observation or surgical correction are chosen, rather than the usage of a pessary.  But if your symptoms were to worsen and you were not ready for a surgery, I wanted you to know that pessaries could be a temporary solution.  About Rectocele  Overview  A rectocele is a type of hernia which causes different degrees of bulging of the rectal tissues into the vaginal wall.  You may even notice that it presses against the vaginal wall so much that some vaginal tissues droop outside of the opening of your vagina.  Causes of Rectocele  The most common cause is childbirth.  The muscles and ligaments in the pelvis that hold up and support the  female organs and vagina become stretched and weakened during labor and delivery.  The more babies you have, the more the support tissues are stretched and weakened.  Not everyone who has a baby will develop a rectocele.  Some women have stronger supporting tissue in the pelvis and may not have as much of a problem as others.  Women who have a Cesarean section usually do not get rectocele's unless they pushed a long time prior to the cesarean delivery.  Other conditions that can cause a rectocele include chronic constipation, a chronic cough, a lot of heavy lifting, and obesity.  Older women may have this problem because the loss of female hormones causes the vaginal tissue to become weaker.  Symptoms  There may not be any symptoms.  If you do have symptoms, they may include:  Pelvic pressure in the rectal area  Protrusion of the lower part of the vagina through the opening of the vagina  Constipation and trapping of the stool, making it difficult to have a bowel movement.  In severe cases, you may have to press on the lower part of your vagina to help push the stool out of you rectum.  This is called splinting to empty.  Diagnosing Rectocele  Your health care provider will ask about your symptoms and perform a pelvic exam.  S/he will ask you to bear down, pushing like you are having a bowel movement so as to see how far the lower part of the vagina protrudes into the vagina  and possible outside of the vagina.  Your provider will also ask you to contract the muscles of your pelvis (like you are stopping the stream in the middle of urinating) to determine the strength of your pelvic muscles.  Your provider may also do a rectal exam.  Treatment Options  If you do not have any symptoms, no treatment may be necessary.  Other treatment options include:  Pelvic floor exercises: Contracting the muscles in your genital area may help strengthen your muscles and support the organs.  Be sure to get proper  exercise instruction from you physical therapist.  A pessary (removealbe pelvic support device) sometimes helps rectocele symptoms.  Surgery: Surgical repair may be necessary. In some cases the uterus may need to be taken out ( a hysterectomy) as well.  There are many types of surgery for pelvic support problems.  Look for physicians who specialize in repair procedures.  You can take care of yourself by:  Treating and preventing constipation  Avoiding heavy lifting, and lifting correctly (with your legs, not with you waist or back)  Treating a chronic cough or bronchitis  Not smoking  avoiding too much weight gain  Doing pelvic floor exercises   2007, [employer] Doc.33

## 2017-10-29 NOTE — Progress Notes (Signed)
    Marie MesaKatrina P Covel 05-26-1971 [id number]        47 y.o.  [id number] Married  RP: Vaginal irritation and bulging in vagina  HPI: S/P Vaginal Hysterectomy with Rogelio SeenMcCall 12/2016.  C/O vaginal irritation x about 3 months with a discharge.  Feeling a bulge from the vagina, which sometimes get irritated.  Fatigue.  Mild hot flushes.  No SUI.  No difficulty passing stools.  No pain with IC.  Using Premarin cream every day.  OB History  Gravida Para Term Preterm AB Living  5 4 4   1 4   SAB TAB Ectopic Multiple Live Births  1       4    # Outcome Date GA Lbr Len/2nd Weight Sex Delivery Anes PTL Lv  5 Term     F Vag-Spont   LIV  4 Term     F Vag-Spont   LIV  3 Term     M Vag-Spont   LIV  2 SAB           1 Term     M Vag-Spont   LIV    Past medical history,surgical history, problem list, medications, allergies, family history and social history were all reviewed and documented in the EPIC chart.   Directed ROS with pertinent positives and negatives documented in the history of present illness/assessment and plan.  Exam:  Vitals:   10/29/17 1408  BP: 134/82  Temp: 98.7 F (37.1 C)  TempSrc: Oral  Weight: 167 lb (75.8 kg)  Height: 5\' 4"  (1.626 m)   General appearance:  Normal  Abdomen: Normal  Gynecologic exam: Vulva normal.  Speculum:  Vagina normal, no inflammation or erosion.  Mildly increased vaginal discharge.  Wet prep done.  Bimanual exam:  Supine with Valsalva: Good suspension of the vaginal vault.  No cystocele.  Rectocele grade 3/3 present.  Standing with Valsalva: Good suspension of the vaginal vault with no cystocele.  Rectocele grade 3/3 present.   Assessment/Plan:  47 y.o. [id number]   1. Baden-Walker grade 3 rectocele Grade 3/3 rectocele.  Patient is mildly symptomatic, prefer observation at this point.  Recommended to avoid pelvic pressure in particular avoiding to push for bowel movements.  Therefore important to treat constipation.  Avoid chronic coughing and heavy  lifting.  Surgical correction of rectocele reviewed.  Will add information on pessary to the summary.  2. Vaginal discharge Yeast vaginitis present.  Diagnosis and management discussed.  Will treat with fluconazole 1 tablet per mouth daily for 3 days.  Prescription sent to pharmacy.  3. Fatigue, unspecified type Possibly perimenopausal, [facility].  Rule out thyroid dysfunction and anemia. - TSH - FSH - CBC  4. Perimenopause As above.  Patient is currently using Premarin cream vaginally.  Was using it daily, recommend to reduce applications to twice weekly.  No pain or dryness with intercourse currently. [facility]  Other orders - fluconazole (DIFLUCAN) 150 MG tablet; Take 1 tablet (150 mg total) by mouth daily for 3 days.  Counseling on above issues and coordination of care more than 50% for 30 minutes.  Genia DelMarie-Lyne Devarious Pavek MD, 2:18 PM 10/29/2017

## 2017-10-30 LAB — CBC
HCT: 40.9 % (ref 35.0–45.0)
Hemoglobin: 14.1 g/dL (ref 11.7–15.5)
MCH: 29.3 pg (ref 27.0–33.0)
MCHC: 34.5 g/dL (ref 32.0–36.0)
MCV: 84.9 fL (ref 80.0–100.0)
MPV: 10.6 fL (ref 7.5–12.5)
PLATELETS: 289 10*3/uL (ref 140–400)
RBC: 4.82 10*6/uL (ref 3.80–5.10)
RDW: 12.6 % (ref 11.0–15.0)
WBC: 9.4 10*3/uL (ref 3.8–10.8)

## 2017-10-30 LAB — FOLLICLE STIMULATING HORMONE: FSH: 12.3 m[IU]/mL

## 2017-10-30 LAB — TSH: TSH: 1.31 m[IU]/L

## 2017-10-31 ENCOUNTER — Other Ambulatory Visit: Payer: Self-pay | Admitting: Internal Medicine

## 2017-11-01 ENCOUNTER — Encounter (HOSPITAL_COMMUNITY): Payer: Self-pay | Admitting: *Deleted

## 2017-11-01 ENCOUNTER — Ambulatory Visit (HOSPITAL_COMMUNITY)
Admission: EM | Admit: 2017-11-01 | Discharge: 2017-11-01 | Disposition: A | Discharge status: Home / Self Care | Payer: 59 | Attending: Family Medicine | Admitting: Family Medicine

## 2017-11-01 ENCOUNTER — Other Ambulatory Visit: Payer: Self-pay

## 2017-11-01 DIAGNOSIS — L03311 Cellulitis of abdominal wall: Secondary | ICD-10-CM

## 2017-11-01 DIAGNOSIS — L0291 Cutaneous abscess, unspecified: Secondary | ICD-10-CM

## 2017-11-01 DIAGNOSIS — L02211 Cutaneous abscess of abdominal wall: Secondary | ICD-10-CM

## 2017-11-01 MED ORDER — DOXYCYCLINE HYCLATE 100 MG PO CAPS: 100.0000 mg | ORAL_CAPSULE | Freq: Two times a day (BID) | ORAL | 0 refills | Status: DC

## 2017-11-01 MED ORDER — MUPIROCIN 2 % EX OINT: 1.0000 "application " | TOPICAL_OINTMENT | Freq: Two times a day (BID) | CUTANEOUS | 0 refills | Status: DC

## 2017-11-01 NOTE — ED Provider Notes (Signed)
[facility]    CSN: [id number] Arrival date & time: 11/01/17  1202     History   Chief Complaint Chief Complaint  Patient presents with  . Wound Check    HPI Marie Webb is a 47 y.o. female.   47 year old female comes in for possible abscess to the right lower abdomen.  States 4 days ago, started as a small red lesion, but given location, has had friction due to  bending motion, and has caused skin irritation.  Has also noticed increased swelling, spreading erythema.  Denies fever, chills, night sweats.  Patient thinks it started out as a bug bite, but she did not see bugs\ticks.  Denies body aches, malaise, joint pain.      Past Medical History:  Diagnosis Date  . Asthma   . Complication of anesthesia   . Depression   . Headache   . Hypertension   . Legally blind   . Night terrors, adult   . PONV (postoperative nausea and vomiting)     Patient Active Problem List   Diagnosis Date Noted  . Right leg numbness 10/12/2017  . Cervical radiculopathy 04/29/2017  . Depression 02/17/2017  . Carpal tunnel syndrome 02/17/2017  . S/P vaginal hysterectomy 12/31/2016  . Night terrors 03/18/2016  . Essential hypertension 03/18/2016  . Mood swings 06/27/2015  . Preventative health care 06/27/2015  . Family history of colon cancer in mother 06/27/2015  . Bilateral arm pain 06/08/2015  . Accessory navicular bone of foot 06/04/2015  . Left foot pain 06/03/2015  . Rash and nonspecific skin eruption 04/20/2015  . Migraine 10/31/2014  . Adjustment disorder with mixed anxiety and depressed mood 06/27/2014  . Sinus tachycardia 06/18/2013  . Asthma     Past Surgical History:  Procedure Laterality Date  . ABDOMINAL HYSTERECTOMY    . ENDOMETRIAL ABLATION    . VAGINAL HYSTERECTOMY N/A 12/31/2016   Procedure: VAGINAL HYSTERECTOMY  WITH VAGINAL APEX SUSPENSION;  Surgeon: Lazaro ArmsEure, Luther H, MD;  Location: [facility];  Service: Gynecology;  Laterality: N/A;    OB History      Gravida  5   Para  4   Term  4   Preterm      AB  1   Living  4     SAB  1   TAB      Ectopic      Multiple      Live Births  4            Home Medications    Prior to Admission medications   Medication Sig Start Date End Date Taking? Authorizing Provider  amitriptyline (ELAVIL) 25 MG tablet TAKE TWO TABLETS BY MOUTH AT BEDTIME Patient taking differently: TAKE ONE TABLET BY MOUTH AT BEDTIME 06/15/17  Yes Marquette SaaLancaster, Abigail Joseph, MD  amLODipine (NORVASC) 5 MG tablet Take 1 tablet (5 mg total) by mouth daily. 12/18/16  Yes Marquette SaaLancaster, Abigail Joseph, MD  BREO ELLIPTA 200-25 MCG/INH AEPB INHALE ONE PUFF BY MOUTH (INTO THE LUNGS) DAILY 03/30/17  Yes Marquette SaaLancaster, Abigail Joseph, MD  conjugated estrogens (PREMARIN) vaginal cream 1 gram nightly 02/05/17  Yes Lazaro ArmsEure, Luther H, MD  fluconazole (DIFLUCAN) 150 MG tablet Take 1 tablet (150 mg total) by mouth daily for 3 days. 10/29/17 11/01/17 Yes Genia DelLavoie, Marie[place], MD  naproxen (NAPROSYN) 500 MG tablet Take 1 tablet (500 mg total) by mouth 2 (two) times daily as needed. 04/29/17  Yes McKeag, Janine OresIan D, MD  albuterol (PROVENTIL) (2.5 MG/3ML) 0.083%  nebulizer solution USE ONE VIAL IN NEBULIZER EVERY 6 HOURS AS NEEDED FOR  WHEEZING  OR  SHORTNESS  OF  BREATH 06/08/17   Marquette Saa, MD  albuterol (VENTOLIN HFA) 108 (90 Base) MCG/ACT inhaler INHALE 1 TO 2 PUFFS BY MOUTH EVERY 6 HOURS AS NEEDED FOR WHEEZING OR SHORTNESS OF BREATH 08/20/17   Marquette Saa, MD  doxycycline (VIBRAMYCIN) 100 MG capsule Take 1 capsule (100 mg total) by mouth 2 (two) times daily. 11/01/17   Cathie Hoops, Amy V, PA-C  mupirocin ointment (BACTROBAN) 2 % Apply 1 application topically 2 (two) times daily. 11/01/17   Cathie Hoops, Amy V, PA-C  Omega-3 Fatty Acids (FISH OIL ULTRA) 1400 MG CAPS Take 1,400 mg by mouth daily.    [provider]  Respiratory Therapy Supplies (NEBULIZER AIR TUBE/PLUGS) MISC Nebulizer tubing 07/17/16   Bacigalupo, Marzella Schlein, MD  SUMAtriptan  (IMITREX) 50 MG tablet Take 1 tablet (50 mg total) by mouth every 2 (two) hours as needed for migraine. May repeat in 2 hours if headache persists or recurs. 12/18/16   Marquette Saa, MD    Family History Family History  Problem Relation Age of Onset  . Colon cancer Maternal Grandmother   . Colon cancer Mother   . Depression Mother   . Alcohol abuse Maternal Grandfather   . Cancer Brother   . Other Sister        killed in MVA  . Alcohol abuse Brother   . Asthma Son     Social History Social History   Tobacco Use  . Smoking status: Never Smoker  . Smokeless tobacco: Never Used  Substance Use Topics  . Alcohol use: Yes    Comment: rare  . Drug use: No     Allergies   Stadol [butorphanol]; Advair diskus [fluticasone-salmeterol]; and Sertraline hcl   Review of Systems Review of Systems  Reason unable to perform ROS: See HPI as above.     Physical Exam Triage Vital Signs ED Triage Vitals  Enc Vitals Group     BP 11/01/17 1218 (!) 157/87     Pulse Rate 11/01/17 1218 97     Resp 11/01/17 1218 16     Temp 11/01/17 1218 98.5 F (36.9 C)     Temp Source 11/01/17 1218 Oral     SpO2 11/01/17 1218 98 %     Weight --      Height --      Head Circumference --      Peak Flow --      Pain Score 11/01/17 1219 6     Pain Loc --      Pain Edu? --      Excl. in GC? --    No data found.  Updated Vital Signs BP (!) 157/87   Pulse 97   Temp 98.5 F (36.9 C) (Oral)   Resp 16   LMP 12/17/2016 (Approximate)   SpO2 98%   Physical Exam  Constitutional: She is oriented to person, place, and time. She appears well-developed and well-nourished. No distress.  HENT:  Head: Normocephalic and atraumatic.  Eyes: Pupils are equal, round, and reactive to light. Conjunctivae are normal.  Neurological: She is alert and oriented to person, place, and time.  Skin:  See picture below.  Surrounding erythema with increased warmth.  Fluctuance felt.       UC Treatments  / Results  Labs (all labs ordered are listed, but only abnormal results are displayed) Labs Reviewed - No  data to display  EKG None  Radiology No results found.  Procedures Incision and Drainage Date/Time: 11/01/2017 12:55 PM Performed by: Belinda Fisher, PA-C Authorized by: Frederica Kuster, MD   Consent:    Consent obtained:  Verbal   Consent given by:  Patient   Risks discussed:  Bleeding, incomplete drainage, infection, pain and damage to other organs   Alternatives discussed:  Alternative treatment Location:    Type:  Abscess   Size:  2cm x 1cm   Location:  Trunk   Trunk location:  Abdomen Pre-procedure details:    Skin preparation:  Betadine Anesthesia (see MAR for exact dosages):    Anesthesia method:  Local infiltration   Local anesthetic:  Lidocaine 2% WITH epi Procedure details:    Incision types:  Stab incision   Incision depth:  Dermal   Scalpel blade:  11   Wound management:  Probed and deloculated and irrigated with saline   Drainage:  Purulent   Drainage amount:  Moderate   Wound treatment:  Wound left open   Packing materials:  None Post-procedure details:    Patient tolerance of procedure:  Tolerated well, no immediate complications   (including critical care time)  Medications Ordered in UC Medications - No data to display  Initial Impression / Assessment and Plan / UC Course  I have reviewed the triage vital signs and the nursing notes.  Pertinent labs & imaging results that were available during my care of the patient were reviewed by me and considered in my medical decision making (see chart for details).    Patient tolerated procedure well.  Given surrounding cellulitis, will start doxycycline as directed.  Bactroban ointment as directed.  Wound care instructions given.  Return precautions given.  Patient expresses understanding and agrees to plan.  Final Clinical Impressions(s) / UC Diagnoses   Final diagnoses:  Abscess    ED  Prescriptions    Medication Sig Dispense Auth. Provider   doxycycline (VIBRAMYCIN) 100 MG capsule Take 1 capsule (100 mg total) by mouth 2 (two) times daily. 20 capsule Cathie Hoops, Amy V, PA-C   mupirocin ointment (BACTROBAN) 2 % Apply 1 application topically 2 (two) times daily. 22 g Threasa Alpha, [place] 11/01/17 1256

## 2017-11-01 NOTE — ED Triage Notes (Signed)
C/O starting with small red lesion to right lower abd approx 4 days ago; area now having some drainage, getting larger, and becoming more red & swollen.  Denies fevers.

## 2017-11-01 NOTE — Discharge Instructions (Signed)
Start doxycycline as directed. Bactroban ointment as directed. Daily dressing, there may still be drainage coming out of incision site. Monitor for spreading redness, increased warmth, fever, follow up for reevaluation.

## 2017-11-12 ENCOUNTER — Other Ambulatory Visit: Payer: Self-pay | Admitting: Internal Medicine

## 2017-11-20 ENCOUNTER — Other Ambulatory Visit: Payer: Self-pay | Admitting: Internal Medicine

## 2018-01-04 ENCOUNTER — Other Ambulatory Visit: Payer: Self-pay | Admitting: Internal Medicine

## 2018-01-14 ENCOUNTER — Ambulatory Visit: Payer: 59 | Admitting: Obstetrics & Gynecology

## 2018-01-15 ENCOUNTER — Ambulatory Visit: Payer: 59 | Admitting: Family Medicine

## 2018-01-15 ENCOUNTER — Telehealth: Payer: Self-pay | Admitting: *Deleted

## 2018-01-15 NOTE — Telephone Encounter (Signed)
Agree with compound Estradiol cream. 

## 2018-01-15 NOTE — Telephone Encounter (Signed)
Patient using premarin vaginal cream, stating cost has increased,not able to afford, asked if another cream could be prescribed that maybe cheaper. Just fyi if compound estradiol cream 0.02% twice weekly is option this is typically affordable for patient. Please advise

## 2018-01-18 NOTE — Telephone Encounter (Signed)
Left message for patient to call.

## 2018-01-21 NOTE — Telephone Encounter (Signed)
Left message for pt to call again. 

## 2018-01-22 MED ORDER — NONFORMULARY OR COMPOUNDED ITEM: 3 refills | Status: DC

## 2018-01-22 NOTE — Telephone Encounter (Signed)
Patient called back and Rx been called

## 2018-03-24 ENCOUNTER — Encounter: Payer: 59 | Admitting: Family Medicine

## 2018-04-01 ENCOUNTER — Other Ambulatory Visit: Payer: Self-pay

## 2018-04-02 MED ORDER — FLUTICASONE FUROATE-VILANTEROL 200-25 MCG/INH IN AEPB: 1.0000 | INHALATION_SPRAY | Freq: Every day | RESPIRATORY_TRACT | 11 refills | Status: DC

## 2018-04-02 MED ORDER — AMLODIPINE BESYLATE 5 MG PO TABS: 5.0000 mg | ORAL_TABLET | Freq: Every day | ORAL | 3 refills | Status: DC

## 2018-04-28 ENCOUNTER — Encounter: Payer: 59 | Admitting: Family Medicine

## 2018-05-03 ENCOUNTER — Encounter: Payer: 59 | Admitting: Family Medicine

## 2018-05-07 ENCOUNTER — Encounter: Payer: 59 | Admitting: Family Medicine

## 2018-05-24 ENCOUNTER — Other Ambulatory Visit (HOSPITAL_COMMUNITY): Payer: Self-pay | Admitting: Family Medicine

## 2018-05-24 DIAGNOSIS — Z1231 Encounter for screening mammogram for malignant neoplasm of breast: Secondary | ICD-10-CM

## 2018-05-27 ENCOUNTER — Encounter: Payer: 59 | Admitting: Obstetrics & Gynecology

## 2018-05-27 ENCOUNTER — Telehealth: Payer: Self-pay | Admitting: *Deleted

## 2018-05-27 MED ORDER — OSELTAMIVIR PHOSPHATE 75 MG PO CAPS: 75.0000 mg | ORAL_CAPSULE | Freq: Two times a day (BID) | ORAL | 0 refills | Status: DC

## 2018-05-27 NOTE — Telephone Encounter (Signed)
Message left on clinic nurse voice mail - Patient calling to request Rx for Tamiflu.  States several of her family members have been diagnosed with flu after being exposed to her grandchild, who was recently diagnosed with "two strains of flu".  Now has body aches, low-grade, and sore throat,  Denies fever.  Will route request to PCP and preceptor (Dr. Jennette Kettle).  Uses [facility] on [address].    Altamese Dilling, MSN, RN-BC

## 2018-05-31 ENCOUNTER — Ambulatory Visit: Payer: 59 | Admitting: Family Medicine

## 2018-06-07 ENCOUNTER — Ambulatory Visit (HOSPITAL_COMMUNITY)
Admission: RE | Admit: 2018-06-07 | Discharge: 2018-06-07 | Disposition: A | Discharge status: Home / Self Care | Payer: 59 | Source: Ambulatory Visit | Attending: Family Medicine | Admitting: Family Medicine

## 2018-06-07 DIAGNOSIS — Z1231 Encounter for screening mammogram for malignant neoplasm of breast: Secondary | ICD-10-CM | POA: Diagnosis present

## 2018-06-14 ENCOUNTER — Telehealth: Payer: Self-pay | Admitting: Family Medicine

## 2018-06-14 NOTE — Telephone Encounter (Signed)
Pt is calling and would like to know if Dr. Dareen Piano has received the results from her mammogram. Pt would like for someone to call her to go over these results.   The best call back number is [phone].

## 2018-06-15 NOTE — Telephone Encounter (Signed)
Attempted to contact patient to inform of normal mammogram results and 1 year follow up per guidelines. LVM for her to return my call.

## 2018-06-30 ENCOUNTER — Encounter: Payer: 59 | Admitting: Family Medicine

## 2018-07-06 ENCOUNTER — Encounter: Payer: Self-pay | Admitting: Family Medicine

## 2018-07-06 ENCOUNTER — Other Ambulatory Visit: Payer: Self-pay

## 2018-07-06 ENCOUNTER — Ambulatory Visit (INDEPENDENT_AMBULATORY_CARE_PROVIDER_SITE_OTHER): Payer: 59 | Admitting: Family Medicine

## 2018-07-06 VITALS — BP 126/80 | Temp 98.3°F | Ht 64.0 in | Wt 173.0 lb

## 2018-07-06 DIAGNOSIS — F339 Major depressive disorder, recurrent, unspecified: Secondary | ICD-10-CM | POA: Diagnosis not present

## 2018-07-06 DIAGNOSIS — G43009 Migraine without aura, not intractable, without status migrainosus: Secondary | ICD-10-CM

## 2018-07-06 DIAGNOSIS — Z01419 Encounter for gynecological examination (general) (routine) without abnormal findings: Secondary | ICD-10-CM | POA: Diagnosis not present

## 2018-07-06 DIAGNOSIS — Z23 Encounter for immunization: Secondary | ICD-10-CM | POA: Insufficient documentation

## 2018-07-06 MED ORDER — FLUOXETINE HCL 10 MG PO CAPS: 10.0000 mg | ORAL_CAPSULE | Freq: Every day | ORAL | 1 refills | Status: DC

## 2018-07-06 NOTE — Patient Instructions (Signed)
Thank you for coming in to see [place] today! Please see below to review our plan for today's visit:  1. Start taking Prozac 10 mg daily for better control of depression. 2. Take 25mg  Amitriptyline daily for migraines. 3. Continue taking your other meds as prescribed. 4. We are drawing blood today to test your cholesterol and will call you with these results. 5. You received your flu shot today!  Please call the clinic at [phone] if your symptoms worsen or you have any concerns. It was our pleasure to serve you!  Dr. Peggyann Shoals [facility]

## 2018-07-06 NOTE — Assessment & Plan Note (Signed)
-   Check lipid panel at today's visit. Patient has a history of elevated cholesterol, wants to address it with diet and exercise. - the patient is working out 1 hour 5 times weekly but has gained weight. No evidence of thyroid issues or symptoms on interview or physical exam but will consider this as possible cause if symptoms do not improve.  - Flu shot given today

## 2018-07-06 NOTE — Assessment & Plan Note (Signed)
PHQ-9 score 9 at today's visit - Patient prescribed Prozac 10mg  today and instructed to f/u in 2-4 weeks for recheck.

## 2018-07-06 NOTE — Progress Notes (Signed)
   Subjective:   Patient ID: Marie Webb, female    DOB: 1971/01/02, 48 y.o.   MRN: [id number]   CC: Physical Exam / Yearly Check up  HPI: Weight gain: the patient reports 18 lbs weight gain in 13 months. She has been taking her Amitriptyline 25mg  for 3 years but 3 weeks ago increased the dosage to 50mg  due to increased depressive symptoms. She reports exercising 5 times weekly for one hour each day on a stationary bike. She also lifts weights with her upper extremities. She eats a varied diet of majority vegetables but also enjoys eating pastas and rice. She denies fatigue, constipation and change in appetite.  Depression: the patient reports feeling depressed a couple times weekly and has a hard time getting out of bed. 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 She denies changes to her appetite, psychomotor agitation and denies SI/HI. She reports sleeping more, losing interest in activities, having lower energy, and having a hard time concentrating and making decisions. Her mother has a history of depression and suicidal attempts after experiencing a family tragedy (sudden, violent loss of the patient's sister). The patient is interested in receiving help of some sort for her depression.    Migraines: the patient started taking Amitriptyline about 3 years ago at 25mg  daily. She used to have 8+ migraines monthly but now only has a couple migraines each month. She denies nausea, vomiting and light sensitivity. She reports sound sensitivity.   Objective:  BP 126/80   Temp 98.3 F (36.8 C) (Oral)   Ht 5\' 4"  (1.626 m)   Wt 173 lb (78.5 kg)   LMP 12/17/2016 (Approximate)   BMI 29.70 kg/m   Physical Exam Cardiovascular:     Rate and Rhythm: Normal rate and regular rhythm.  Pulmonary:     Breath sounds: Normal breath sounds.  Abdominal:     General: Bowel sounds are normal.    Musculoskeletal: Normal range of motion.     Right lower leg: No edema.     Left lower leg: No edema.  Neurological:     General: No focal deficit present.     Mental Status: She is alert.     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 1. Patient instructed to take 25mg  Amitriptyline daily for migraines.   Encounter for well woman exam - Check lipid panel at today's visit. Patient has a history of elevated cholesterol, wants to address it with diet and exercise. - the patient is working out 1 hour 5 times weekly but has gained weight. No evidence of thyroid issues or symptoms on interview or physical exam but will consider this as possible cause if symptoms do not improve.  - Flu shot given today  Need for immunization against influenza Patient given flu shot today 07/06/2018  Depression PHQ-9 score 9 at today's visit - Patient prescribed Prozac 10mg  today and instructed to f/u in 2-4 weeks for recheck.  Return in about 4 weeks (around 08/03/2018) for F/u for depression since starting Prozac.  Dr. Peggyann Shoals [facility], PGY-1

## 2018-07-06 NOTE — Assessment & Plan Note (Signed)
Patient given flu shot today 07/06/2018

## 2018-07-06 NOTE — Assessment & Plan Note (Signed)
Migraine without Aura. 1. Patient instructed to take 25mg  Amitriptyline daily for migraines.

## 2018-07-07 LAB — LIPID PANEL
CHOL/HDL RATIO: 3.7 ratio (ref 0.0–4.4)
CHOLESTEROL TOTAL: 247 mg/dL — AB (ref 100–199)
HDL: 67 mg/dL (ref >39)
LDL CALC: 153 mg/dL — AB (ref 0–99)
Triglycerides: 134 mg/dL (ref 0–149)
VLDL CHOLESTEROL CAL: 27 mg/dL (ref 5–40)

## 2018-07-09 ENCOUNTER — Telehealth: Payer: Self-pay | Admitting: Family Medicine

## 2018-07-09 NOTE — Telephone Encounter (Signed)
Pt is calling to let know Dr. Dareen Piano know that she received her voicemail and that she greatly appreciated her calling to inform her about her cholesterol.   Pt said if she has any further questions that she will discuss them with her at her next appointment.

## 2018-07-13 ENCOUNTER — Telehealth: Payer: Self-pay | Admitting: Family Medicine

## 2018-07-13 NOTE — Telephone Encounter (Signed)
Pt said she was told a medication was being sent into the pharmacy for her cholestoral but the pharmacy has not received a prescription for this yet. Please call pt to discuss this and whether or not a medication should be called in. Please advise.

## 2018-07-13 NOTE — Telephone Encounter (Signed)
Will forward to MD as new script was never sent per Epic.  Jazmin Hartsell,CMA

## 2018-07-20 NOTE — Telephone Encounter (Signed)
LM for patient to callback.  Will inform her of medication update when she does.  Nikolaus Pienta,CMA

## 2018-07-20 NOTE — Telephone Encounter (Signed)
Patient called and message given.  Nate Perri,CMA

## 2018-08-04 ENCOUNTER — Other Ambulatory Visit: Payer: Self-pay | Admitting: Family Medicine

## 2018-08-04 ENCOUNTER — Telehealth: Payer: Self-pay | Admitting: Family Medicine

## 2018-08-04 DIAGNOSIS — F339 Major depressive disorder, recurrent, unspecified: Secondary | ICD-10-CM

## 2018-08-04 MED ORDER — FLUOXETINE HCL 20 MG PO CAPS: 20.0000 mg | ORAL_CAPSULE | Freq: Every day | ORAL | 1 refills | Status: DC

## 2018-08-04 NOTE — Telephone Encounter (Signed)
Pt calling concerning her Prozac medication. She thinks she may need a higher dosage because the current dosage is not changing anything. Pt would like to try the higher dosage. I told pt that per Dr. Ewell Poe last OV notes, she wanted pt to f/u with her in a month from her last visit to check up on her depression and the start of this medication. Pt made the first available appt with Dareen Piano while on the phone, but would still like for Dareen Piano to give her a call regarding this. Please advise

## 2018-08-04 NOTE — Telephone Encounter (Signed)
Will forward to MD.  Patient scheduled her appt for 08-27-2018.  Parrie Rasco,CMA

## 2018-08-04 NOTE — Progress Notes (Signed)
Patient states the Prozac is not doing anything for her and is requesting a higher dose.  She continues to have down days each week, feelings of guilt, and experiencing the same loss of interest as her previous appointment. She continues to have difficulty getting out of bed in the morning, continues to have low energy and has a hard time concentrating/making decisions. She's been boosting her exercise regimen by getting outside more in the nice weather and has been bowling with her husband. The patient intends to follow up with me in the clinic in April 2020. Until then we will trial the patient on Prozac 20mg  daily.  Peggyann Shoals, DO [facility], PGY-1 08/04/2018 3:53 PM

## 2018-08-27 ENCOUNTER — Ambulatory Visit: Payer: 59 | Admitting: Family Medicine

## 2018-08-30 ENCOUNTER — Other Ambulatory Visit: Payer: Self-pay | Admitting: *Deleted

## 2018-08-30 MED ORDER — SUMATRIPTAN SUCCINATE 50 MG PO TABS: 50.0000 mg | ORAL_TABLET | ORAL | 6 refills | Status: DC | PRN

## 2018-09-02 ENCOUNTER — Telehealth: Payer: Self-pay | Admitting: Family Medicine

## 2018-09-02 NOTE — Telephone Encounter (Signed)
Patient is complaining of 3 days of right ear pain.  Patient has a history of migraines for which she takes Imitrex.  Patient also took ibuprofen which she stated helped a little bit.  Patient states she had her daughter who is a nurse look inside her right ear with a light stated that it was not red.  Patient does not have any tenderness to palpation of the ear canal when she touches her outer ear.  There is no swelling around the ear per patient.  Patient has no loss of hearing.  Patient states she has not had a fever.  Patient likely has TMJ syndrome, less likely outer ear infection.  Advised patient that she could take 2 weeks of over-the-counter NSAIDs such as ibuprofen.  Patient has no history of kidney disease, GI bleeding, heart disease.  Stated she will call back on Monday if things have not improved.

## 2018-10-22 ENCOUNTER — Other Ambulatory Visit: Payer: Self-pay

## 2018-10-25 MED ORDER — AMITRIPTYLINE HCL 25 MG PO TABS: 50.0000 mg | ORAL_TABLET | Freq: Every day | ORAL | 3 refills | Status: DC

## 2018-11-30 ENCOUNTER — Telehealth: Payer: Self-pay | Admitting: *Deleted

## 2018-11-30 NOTE — Telephone Encounter (Signed)
Pt states that [facility] is costing her $40 / month (coupon no longer available).  She contacted the insurance company and they gave her the following suggestions of lower cost meds.  Alvesco Asmanex  She wonders if she can use one of these instead.   To PCP. Christen Bame, CMA

## 2018-12-03 NOTE — Telephone Encounter (Signed)
I called her - she managed to work it out with her insurance and will stay on the [facility] for now.

## 2019-01-20 ENCOUNTER — Other Ambulatory Visit: Payer: Self-pay | Admitting: Family Medicine

## 2019-01-20 DIAGNOSIS — F339 Major depressive disorder, recurrent, unspecified: Secondary | ICD-10-CM

## 2019-02-04 ENCOUNTER — Encounter: Payer: 59 | Admitting: Obstetrics & Gynecology

## 2019-02-10 ENCOUNTER — Telehealth: Payer: Self-pay | Admitting: *Deleted

## 2019-02-10 NOTE — Telephone Encounter (Signed)
Pt states that Advair is a tier 1 medication with her insurance.  She wants to know if this would be a good replacement for Breo. Christen Bame, CMA

## 2019-02-14 ENCOUNTER — Other Ambulatory Visit: Payer: Self-pay | Admitting: Family Medicine

## 2019-02-14 DIAGNOSIS — J4520 Mild intermittent asthma, uncomplicated: Secondary | ICD-10-CM

## 2019-02-14 MED ORDER — FLUTICASONE-SALMETEROL 250-50 MCG/DOSE IN AEPB: 1.0000 | INHALATION_SPRAY | Freq: Two times a day (BID) | RESPIRATORY_TRACT | 3 refills | Status: DC

## 2019-02-14 NOTE — Telephone Encounter (Signed)
Prescription for Advair sent to patient's pharmacy, Breo discontinued.  Milus Banister, [place], PGY-2 02/14/2019 5:41 PM

## 2019-02-21 ENCOUNTER — Other Ambulatory Visit: Payer: Self-pay | Admitting: *Deleted

## 2019-02-21 MED ORDER — AMLODIPINE BESYLATE 5 MG PO TABS: 5.0000 mg | ORAL_TABLET | Freq: Every day | ORAL | 3 refills | Status: DC

## 2019-02-28 ENCOUNTER — Telehealth: Payer: Self-pay

## 2019-02-28 NOTE — Telephone Encounter (Signed)
I will look out for paperwork. Thank you!  Milus Banister, [place], PGY-2 02/28/2019 4:18 PM

## 2019-02-28 NOTE — Telephone Encounter (Signed)
Pt calling nurse line to let Dr. Clayton Bibles know that the generic for Advir is more expensive than the [facility]. Pt is being approved with the health dept for help with medications. Dr. Ouida Sills will be getting some paperwork asking for a list of medicaitons for pt. Pt wants to stay on Brevo and health dept will pay for it. Ottis Stain, CMA

## 2019-03-02 ENCOUNTER — Telehealth: Payer: Self-pay | Admitting: *Deleted

## 2019-03-02 MED ORDER — BREO ELLIPTA 200-25 MCG/INH IN AEPB: 1.0000 | INHALATION_SPRAY | Freq: Every day | RESPIRATORY_TRACT | 0 refills | Status: DC

## 2019-03-02 NOTE — Telephone Encounter (Signed)
Pt is requesting a sample of breo (see last phone note).  Contacted PCP she is fine with giving sample. Placed at front for pickup and pt informed. Christen Bame, CMA

## 2019-03-07 ENCOUNTER — Other Ambulatory Visit: Payer: Self-pay | Admitting: Family Medicine

## 2019-03-09 NOTE — Telephone Encounter (Signed)
Pt called just asking about the paperwork and making sure its in process. [phone].

## 2019-03-09 NOTE — Telephone Encounter (Signed)
I will call patient back and will let her know this. Thank you.

## 2019-03-09 NOTE — Telephone Encounter (Signed)
Yes, papers were filled out and sent to be faxed on Monday of this week (10/12).  Milus Banister, [place], PGY-2 03/09/2019 1:55 PM

## 2019-03-15 ENCOUNTER — Encounter: Payer: 59 | Admitting: Obstetrics & Gynecology

## 2019-03-16 ENCOUNTER — Telehealth: Payer: Self-pay | Admitting: Family Medicine

## 2019-03-16 NOTE — Telephone Encounter (Signed)
MAP form completion request from [facility] Department fpr Prozac 20 mg qd and Breo.  Per documentation, the last dose of Prozac for Depression documented by PCP was 10 mg qd, although refill indicates 20 mg.  For Asthma, per documentation she is on [facility].  PCP need to contact patient for an appointment to update problem list management plan clearly documented on file.  I called the health department; spoke with Crissie Figures and notify her that we are unable to complete form at this time pending record update.  I will forward message to her PCP and put a copy of the form in PCP's box to follow.

## 2019-03-17 ENCOUNTER — Ambulatory Visit: Payer: PRIVATE HEALTH INSURANCE | Admitting: Family Medicine

## 2019-03-17 ENCOUNTER — Other Ambulatory Visit: Payer: Self-pay

## 2019-03-17 ENCOUNTER — Ambulatory Visit: Payer: 59 | Admitting: Family Medicine

## 2019-03-17 ENCOUNTER — Telehealth: Payer: Self-pay | Admitting: Family Medicine

## 2019-03-17 ENCOUNTER — Encounter: Payer: Self-pay | Admitting: Family Medicine

## 2019-03-17 DIAGNOSIS — J4520 Mild intermittent asthma, uncomplicated: Secondary | ICD-10-CM

## 2019-03-17 DIAGNOSIS — Z23 Encounter for immunization: Secondary | ICD-10-CM | POA: Diagnosis not present

## 2019-03-17 DIAGNOSIS — F339 Major depressive disorder, recurrent, unspecified: Secondary | ICD-10-CM | POA: Diagnosis not present

## 2019-03-17 NOTE — Assessment & Plan Note (Signed)
Depression doing well on 20 mg Prozac at this time.  Patient states that going from 10 mg to 20 mg seems to make a noticeable difference and that while she still has occasional "down" days she feels overall improved.  She denies suicidal ideations or any thoughts of harming herself.  Plan: -Will submit prescription for 20 mg Prozac to [place]

## 2019-03-17 NOTE — Telephone Encounter (Signed)
Marie Webb spoke with patient and informed her that PCP has already updated Dr. Vanessa Keddie on concerns and paperwork given to him.  Jazmin Hartsell,CMA

## 2019-03-17 NOTE — Telephone Encounter (Signed)
She spoke to the health department and they are saying that she needs some more information to get financial aid for her medications.  She asked to speak to her doctor's nurse.  She says paperwork for this has been sent to [place].  Please call her to discuss at [phone].  She was told that she needs to come in about this and will be in today but she is not sure if the doctor she is seeing today can help her because she has been unable to see her actual pcp.  So, please call her today before her appt as she wants to make sure this will be settled today if she has to come in bc she is not seeing her pcp today.

## 2019-03-17 NOTE — Assessment & Plan Note (Signed)
Currently doing well on Breo with albuterol rescue inhaler and nebulizer as needed. Plan: -Will send in prescriptions to [place] for [facility] and albuterol.

## 2019-03-17 NOTE — Patient Instructions (Addendum)
It was great to see you!  Our plans for today:  -Today we discussed your current asthma and depression medications -I will send the signed prescription forms to the [place].  Take care and seek immediate care sooner if you develop any concerns.   Dr. [facility]

## 2019-03-17 NOTE — Progress Notes (Signed)
   Subjective:    Patient ID: Marie Webb, female    DOB: [birth year], 48 y.o.   MRN: [id number]   CC: Medication management  HPI:  Asthma: Doing well on Breo. Patient states that on the days she does not take it she has to use her rescue inhaler at night. She states that she recently had a free sample of Breo and has been trying to space it out to every other day in order to help make it last. Currently having to use her rescue inhaler only when she has a URI while on her Breo. She states she has tried in the past on not taking her medications to see how well she does and during these times she finds she has to use her nebulizer and rescue inhaler a lot.  Depression: Doing well. Family has a lot of depression and since increasing to 20mg  has had an improvement from the 10mg . She states she still has some days where she feels "down" but they are less frequent now. She is consistent about taking her medication every day. She denies thoughts of suicide or self harm.  ROS: pertinent noted in the HPI   Pertinent PMH, PSH, FH, SoHx: Only drinks on special occasions, no drugs, no tobacco   Smoking status - Never smoker, second hand smoke hx but not in years.  Objective:  BP 110/80   Pulse (!) 109   Wt 171 lb 6.4 oz (77.7 kg)   LMP 12/17/2016 (Approximate)   SpO2 97%   BMI 29.42 kg/m   Vitals and nursing note reviewed  General: NAD, pleasant, able to participate in exam Cardiac: RRR, no murmurs. Respiratory: CTAB, normal effort, No wheezes, rales or rhonchi Psych: Normal affect and mood   Assessment & Plan:    Asthma Currently doing well on Breo with albuterol rescue inhaler and nebulizer as needed. Plan: -Will send in prescriptions to [place] for [facility] and albuterol.  Depression Depression doing well on 20 mg Prozac at this time.  Patient states that going from 10 mg to 20 mg seems to make a noticeable difference and that while she still has  occasional "down" days she feels overall improved.  She denies suicidal ideations or any thoughts of harming herself.  Plan: -Will submit prescription for 20 mg Prozac to [place]  Flu shot given.  Lurline Del, [facility] PGY-1

## 2019-05-11 ENCOUNTER — Encounter: Payer: 59 | Admitting: Obstetrics & Gynecology

## 2019-06-08 ENCOUNTER — Other Ambulatory Visit: Payer: Self-pay

## 2019-06-08 ENCOUNTER — Telehealth (INDEPENDENT_AMBULATORY_CARE_PROVIDER_SITE_OTHER): Payer: PRIVATE HEALTH INSURANCE | Admitting: Family Medicine

## 2019-06-09 NOTE — Progress Notes (Signed)
Canceled appt.

## 2019-07-01 ENCOUNTER — Telehealth: Payer: Self-pay | Admitting: Family Medicine

## 2019-07-01 NOTE — Telephone Encounter (Signed)
Patient with history of asthma now with new cough for the past 3 days after presumed exposure at work.  Has had some fever and chills and fatigue but does not feel that she requires emergency department at this point.  Calls mainly to get information on symptoms requiring the emergency department as well as advice on how to schedule Covid testing for work.  She is speaking in full sentences on the phone and processing information appropriately, emergency precautions discussed as well as at least 10-day isolation.  Since start of symptoms if this test comes back positive.  Given information to text for scheduling Covid test at [facility]  Patient understands         Recommended patient schedule an appointment to be tested in one of the following ways:  Text "COVID" to [id number] OR log onto [email or web address] Patient can also call [phone].   There are three locations for testing, hours vary.   [facility] ([facility] in [place]) [address] [place], [place] [address] or [place] ([place]) [address] [place], [place] [address] or [address] (near [place] in [place]) [place], [place] [address]  Patient advised of hours 8am-3:30pm and that they should be in line by 3 PM. -ED precautions discussed and patient expressed good understanding -Patient counseled on wearing a mask and frequently washing hands -Patient instructed to avoid others until they meet criteria for ending isolation after any suspected COVID, which are:  -24 hours with no fever (without medications) and  -Respiratory symptoms have resolved (e.g. cough, shortness of breath) and -10 days since symptoms first appeared

## 2019-07-04 ENCOUNTER — Telehealth: Payer: Self-pay | Admitting: *Deleted

## 2019-07-04 ENCOUNTER — Telehealth: Payer: Self-pay

## 2019-07-04 NOTE — Telephone Encounter (Signed)
Pt tested positive for Covid, she would like the following to be prepared in case she has SOB.  1. New script for albuterol nebulizer medication sent to [facility] in [place]  2. Script for new tubing sent to [facility] in [place].   To PCP. Jone Baseman, CMA

## 2019-07-04 NOTE — Telephone Encounter (Signed)
Pt calls nurse line regarding questions with COVID. Patient states that she has been exposed to covid and has been tested this morning. Patient states that she has been having cough, fever, muscle aches and loss of smell. Patient asking if there was any medication she could take to help with symptoms. Informed patient that symptom management would include tylenol/motrin for pain and fever, humidifier, rest, increasing fluids, and to sleep on side or stomach (avoid spending a lot of time on back).  ED precautions given  To PCP  Veronda Prude, RN

## 2019-07-06 NOTE — Telephone Encounter (Signed)
Pt calling back to follow up on requests for albuterol solution to be sent to pharmacy.   To PCP  Veronda Prude, RN

## 2019-07-07 ENCOUNTER — Other Ambulatory Visit: Payer: Self-pay | Admitting: Family Medicine

## 2019-07-07 DIAGNOSIS — J4520 Mild intermittent asthma, uncomplicated: Secondary | ICD-10-CM

## 2019-07-07 MED ORDER — NEBULIZER AIR TUBE/PLUGS MISC: 2 refills | Status: DC

## 2019-07-07 MED ORDER — ALBUTEROL SULFATE (2.5 MG/3ML) 0.083% IN NEBU: INHALATION_SOLUTION | RESPIRATORY_TRACT | 12 refills | Status: DC

## 2019-07-07 NOTE — Telephone Encounter (Signed)
Pt informed.  Curties Conigliaro, CMA  

## 2019-07-13 ENCOUNTER — Other Ambulatory Visit: Payer: Self-pay

## 2019-07-13 ENCOUNTER — Telehealth: Payer: PRIVATE HEALTH INSURANCE | Admitting: Family Medicine

## 2019-07-18 ENCOUNTER — Encounter: Payer: PRIVATE HEALTH INSURANCE | Admitting: Family Medicine

## 2019-07-22 ENCOUNTER — Encounter: Payer: Self-pay | Admitting: Family Medicine

## 2019-07-22 NOTE — Progress Notes (Signed)
Medication refill request for Prozac from [place], completed. Form placed in the mail out box

## 2019-08-04 ENCOUNTER — Telehealth: Payer: Self-pay

## 2019-08-04 NOTE — Telephone Encounter (Signed)
Pt tested positive for COVID in February. How long should she wait to get the vaccine?  Pt has heard 45 days and 90 days. Please call pt on [phone]. Sunday Spillers, CMA

## 2019-08-09 NOTE — Telephone Encounter (Signed)
Thanks for the info! The ever changing process!  Sunday Spillers, CMA

## 2019-08-15 ENCOUNTER — Ambulatory Visit: Payer: PRIVATE HEALTH INSURANCE | Attending: Internal Medicine

## 2019-08-30 ENCOUNTER — Other Ambulatory Visit: Payer: Self-pay

## 2019-09-01 MED ORDER — SUMATRIPTAN SUCCINATE 50 MG PO TABS: 50.0000 mg | ORAL_TABLET | ORAL | 6 refills | Status: DC | PRN

## 2019-10-07 DIAGNOSIS — Z20828 Contact with and (suspected) exposure to other viral communicable diseases: Secondary | ICD-10-CM | POA: Diagnosis not present

## 2019-10-14 DIAGNOSIS — Z20828 Contact with and (suspected) exposure to other viral communicable diseases: Secondary | ICD-10-CM | POA: Diagnosis not present

## 2019-10-15 DIAGNOSIS — Z20828 Contact with and (suspected) exposure to other viral communicable diseases: Secondary | ICD-10-CM | POA: Diagnosis not present

## 2019-10-21 DIAGNOSIS — Z20828 Contact with and (suspected) exposure to other viral communicable diseases: Secondary | ICD-10-CM | POA: Diagnosis not present

## 2019-11-08 ENCOUNTER — Telehealth: Payer: Self-pay | Admitting: Family Medicine

## 2019-11-08 ENCOUNTER — Encounter: Payer: Self-pay | Admitting: *Deleted

## 2019-11-08 NOTE — Telephone Encounter (Signed)
Letter mailed to patient. Dawood Spitler,CMA  

## 2019-11-08 NOTE — Telephone Encounter (Signed)
Need PCP f/u for Depression and Prozac refills.

## 2019-11-08 NOTE — Telephone Encounter (Signed)
LVM  for a return to call to our office to schedule an appt. Sunday Spillers, CMA

## 2019-11-21 ENCOUNTER — Encounter: Payer: PRIVATE HEALTH INSURANCE | Admitting: Family Medicine

## 2019-12-15 ENCOUNTER — Other Ambulatory Visit (HOSPITAL_COMMUNITY): Payer: Self-pay | Admitting: Obstetrics and Gynecology

## 2019-12-15 DIAGNOSIS — Z1231 Encounter for screening mammogram for malignant neoplasm of breast: Secondary | ICD-10-CM

## 2019-12-21 ENCOUNTER — Encounter: Payer: Self-pay | Admitting: Family Medicine

## 2019-12-21 ENCOUNTER — Ambulatory Visit (INDEPENDENT_AMBULATORY_CARE_PROVIDER_SITE_OTHER): Payer: BC Managed Care – PPO | Admitting: Family Medicine

## 2019-12-21 ENCOUNTER — Other Ambulatory Visit: Payer: Self-pay

## 2019-12-21 VITALS — BP 142/70 | HR 108 | Ht 64.0 in | Wt 176.0 lb

## 2019-12-21 DIAGNOSIS — Z6830 Body mass index (BMI) 30.0-30.9, adult: Secondary | ICD-10-CM

## 2019-12-21 DIAGNOSIS — Z0000 Encounter for general adult medical examination without abnormal findings: Secondary | ICD-10-CM | POA: Diagnosis not present

## 2019-12-21 DIAGNOSIS — I10 Essential (primary) hypertension: Secondary | ICD-10-CM

## 2019-12-21 DIAGNOSIS — Z1159 Encounter for screening for other viral diseases: Secondary | ICD-10-CM | POA: Insufficient documentation

## 2019-12-21 DIAGNOSIS — E1169 Type 2 diabetes mellitus with other specified complication: Secondary | ICD-10-CM | POA: Diagnosis not present

## 2019-12-21 DIAGNOSIS — E785 Hyperlipidemia, unspecified: Secondary | ICD-10-CM

## 2019-12-21 LAB — POCT GLYCOSYLATED HEMOGLOBIN (HGB A1C): Hemoglobin A1C: 5.3 % (ref 4.0–5.6)

## 2019-12-21 NOTE — Assessment & Plan Note (Signed)
Checking routine screening labs at today's visit. -Hepatitis C screen -HbA1c -BMP -Lipid panel -Patient can go get Shingrix at earliest convenience -Reached out to pharmacy team regarding continue his home blood pressure monitoring to further evaluate high blood pressure

## 2019-12-21 NOTE — Assessment & Plan Note (Addendum)
Last lipid panel February 2020 showing elevated cholesterol at 247 and LDL at 153.  ASCVD risk relatively low at 4.5%.  Patient not currently on a statin, however is taking fish oil supplementation. -Rechecking lipid panel -Can continue fish oil

## 2019-12-21 NOTE — Progress Notes (Addendum)
SUBJECTIVE:   CHIEF COMPLAINT / HPI:   Health maintenance / Well adult: Patient presents today for her annual wellness visit.  Reports that she sees OB/GYN for her Pap smear.  Has an upcoming mammogram scheduled.  She had a colonoscopy in April 2017 due to family history of her mother being diagnosed with colon cancer at age 51, eventually succumbed to the disease around 51 years old. -Patient overdue for A1c check -Checking hepatitis C screening today -Patient would also like Shingrix vaccination, instructed she can go to her local pharmacy and have this done without needing a prescription or order from physician  Hypertension: Patient's blood pressure today 142/70.  Denies any concerning symptoms such as headache, chest pain, shortness of breath, and abdominal pain. -Due to history of high blood pressure will check BMP  Hyperlipidemia: Patient's last lipid panel drawn February 2020 showed cholesterol elevated at 247, LDL elevated at 153.  ASCVD risk is low at 4.5%.  Patient not currently on a statin or other cholesterol-lowering medication -Rechecking lipid panel at today's visit   PERTINENT  PMH / PSH:  Patient Active Problem List   Diagnosis Date Noted  . Encounter for wellness examination in adult 12/21/2019  . Need for hepatitis C screening test 12/21/2019  . Hyperlipidemia associated with type 2 diabetes mellitus (HCC) 12/21/2019  . BMI 30.0-30.9,adult 12/21/2019  . Right leg numbness 10/12/2017  . Cervical radiculopathy 04/29/2017  . Depression 02/17/2017  . Carpal tunnel syndrome 02/17/2017  . S/P vaginal hysterectomy 12/31/2016  . Night terrors 03/18/2016  . Essential hypertension 03/18/2016  . Family history of colon cancer in mother 06/27/2015  . Bilateral arm pain 06/08/2015  . Accessory navicular bone of foot 06/04/2015  . Left foot pain 06/03/2015  . Migraine 10/31/2014  . Adjustment disorder with mixed anxiety and depressed mood 06/27/2014  . Sinus  tachycardia 06/18/2013  . Asthma      OBJECTIVE:   BP (!) 142/70 Comment: provider informed  Pulse (!) 108 Comment: provider informed  Ht 5\' 4"  (1.626 m)   Wt 176 lb (79.8 kg)   LMP 12/17/2016 (Approximate)   SpO2 98%   BMI 30.21 kg/m   Physical exam: General: Pleasant patient, nontoxic-appearing, no apparent distress Respiratory: CTA bilaterally, comfortable work of breathing no wheezes appreciated on auscultation Cardio: RRR, S1-S2 present, no murmurs appreciated Abdomen: Soft, nontender to palpation, normal bowel sounds appreciated throughout Extremities: No edema, cyanosis, or deformity appreciated to bilateral lower extremities, 2+ DP pulses bilaterally   ASSESSMENT/PLAN:   Essential hypertension Blood pressure today 142/70.  Patient taking her amlodipine 5 mg daily.  Says that her blood pressure is much better at home, spikes when she comes into the doctor's office. -Continue amlodipine 5 mg daily -Reached out to Dr. 12/19/2016 in the pharmacy team regarding continues on blood pressure monitoring  Hyperlipidemia associated with type 2 diabetes mellitus (HCC) Last lipid panel February 2020 showing elevated cholesterol at 247 and LDL at 153.  ASCVD risk relatively low at 4.5%.  Patient not currently on a statin, however is taking fish oil supplementation. -Rechecking lipid panel -Can continue fish oil  Need for hepatitis C screening test -Checking hepatitis C antibody as part of routine screening today  BMI 30.0-30.9,adult Patient's BMI today 30. -Checking HbA1c at today's visit, no history of polyuria, polydipsia, polyphagia to raise concern for diabetes  Encounter for wellness examination in adult Checking routine screening labs at today's visit. -Hepatitis C screen -HbA1c -BMP -Lipid panel -Patient can go  get Shingrix at earliest convenience -Reached out to pharmacy team regarding continue his home blood pressure monitoring to further evaluate high blood pressure       Dollene Cleveland, DO [facility]

## 2019-12-21 NOTE — Assessment & Plan Note (Signed)
Patient's BMI today 30. -Checking HbA1c at today's visit, no history of polyuria, polydipsia, polyphagia to raise concern for diabetes

## 2019-12-21 NOTE — Assessment & Plan Note (Signed)
Blood pressure today 142/70.  Patient taking her amlodipine 5 mg daily.  Says that her blood pressure is much better at home, spikes when she comes into the doctor's office. -Continue amlodipine 5 mg daily -Reached out to Dr. Raymondo Band in the pharmacy team regarding continues on blood pressure monitoring

## 2019-12-21 NOTE — Patient Instructions (Signed)
Thank you for coming in to see [place] today! Please see below to review our plan for today's visit:  1. We are checking Lipid Panel, Hep C screening, HbA1c and Kidney and Electrolyte.  2. I can have our Pharmacy team reach out to you regarding BP checks.  Please call the clinic at [phone] if your symptoms worsen or you have any concerns. It was our pleasure to serve you!   Dr. Peggyann Shoals [facility]

## 2019-12-21 NOTE — Assessment & Plan Note (Signed)
-  Checking hepatitis C antibody as part of routine screening today

## 2019-12-22 LAB — LIPID PANEL
Chol/HDL Ratio: 4.1 ratio (ref 0.0–4.4)
Cholesterol, Total: 269 mg/dL — ABNORMAL HIGH (ref 100–199)
HDL: 65 mg/dL (ref >39)
LDL Chol Calc (NIH): 177 mg/dL — ABNORMAL HIGH (ref 0–99)
Triglycerides: 152 mg/dL — ABNORMAL HIGH (ref 0–149)
VLDL Cholesterol Cal: 27 mg/dL (ref 5–40)

## 2019-12-22 LAB — BASIC METABOLIC PANEL
BUN/Creatinine Ratio: 20 (ref 9–23)
BUN: 13 mg/dL (ref 6–24)
CO2: 25 mmol/L (ref 20–29)
Calcium: 9.6 mg/dL (ref 8.7–10.2)
Chloride: 101 mmol/L (ref 96–106)
Creatinine, Ser: 0.65 mg/dL (ref 0.57–1.00)
GFR calc Af Amer: 119 mL/min/{1.73_m2} (ref >59)
GFR calc non Af Amer: 103 mL/min/{1.73_m2} (ref >59)
Glucose: 101 mg/dL — ABNORMAL HIGH (ref 65–99)
Potassium: 4.4 mmol/L (ref 3.5–5.2)
Sodium: 142 mmol/L (ref 134–144)

## 2019-12-22 LAB — HEPATITIS C ANTIBODY: Hep C Virus Ab: <0.1 s/co ratio (ref 0.0–0.9)

## 2019-12-26 ENCOUNTER — Other Ambulatory Visit: Payer: Self-pay

## 2019-12-26 ENCOUNTER — Ambulatory Visit (HOSPITAL_COMMUNITY)
Admission: RE | Admit: 2019-12-26 | Discharge: 2019-12-26 | Disposition: A | Discharge status: Home / Self Care | Payer: BC Managed Care – PPO | Source: Ambulatory Visit | Attending: Obstetrics and Gynecology | Admitting: Obstetrics and Gynecology

## 2019-12-26 DIAGNOSIS — Z1231 Encounter for screening mammogram for malignant neoplasm of breast: Secondary | ICD-10-CM | POA: Diagnosis not present

## 2019-12-29 ENCOUNTER — Telehealth: Payer: Self-pay | Admitting: Family Medicine

## 2019-12-29 NOTE — Telephone Encounter (Signed)
Will forward to MD. Cleveland Paiz,CMA  

## 2019-12-29 NOTE — Telephone Encounter (Signed)
Requesting a call regarding test results.  Call after 4 pm   Ph # [phone]

## 2020-01-02 ENCOUNTER — Other Ambulatory Visit: Payer: Self-pay

## 2020-01-02 ENCOUNTER — Ambulatory Visit (INDEPENDENT_AMBULATORY_CARE_PROVIDER_SITE_OTHER): Payer: BC Managed Care – PPO | Admitting: Obstetrics & Gynecology

## 2020-01-02 ENCOUNTER — Encounter: Payer: Self-pay | Admitting: Obstetrics & Gynecology

## 2020-01-02 VITALS — BP 130/70 | Ht 64.0 in | Wt 168.0 lb

## 2020-01-02 DIAGNOSIS — N816 Rectocele: Secondary | ICD-10-CM | POA: Diagnosis not present

## 2020-01-02 DIAGNOSIS — N952 Postmenopausal atrophic vaginitis: Secondary | ICD-10-CM | POA: Diagnosis not present

## 2020-01-02 DIAGNOSIS — Z0000 Encounter for general adult medical examination without abnormal findings: Secondary | ICD-10-CM | POA: Diagnosis not present

## 2020-01-02 DIAGNOSIS — Z90710 Acquired absence of both cervix and uterus: Secondary | ICD-10-CM | POA: Diagnosis not present

## 2020-01-02 DIAGNOSIS — Z800 Family history of malignant neoplasm of digestive organs: Secondary | ICD-10-CM

## 2020-01-02 NOTE — Progress Notes (Signed)
Marie Webb 1971-01-06 [id number]   History:    49 y.o. [id number] Married  RP:  Established patient presenting for annual gyn exam   HPI: S/P Vaginal Hysterectomy with Rogelio Seen 12/2016.  No SUI.  Rectocele grade 3/3, but no difficulty passing stools.  No pain with IC.  Prescribed Compound Estradiol cream, but not used because of Insurance issues, would like to start.  Breasts normal.  BMI 28.84.  [place] 2017.  Past medical history,surgical history, family history and social history were all reviewed and documented in the EPIC chart.  Gynecologic History S/P Hysterectomy  Obstetric History OB History  Gravida Para Term Preterm AB Living  5 4 4   1 4   SAB TAB Ectopic Multiple Live Births  1       4    # Outcome Date GA Lbr Len/2nd Weight Sex Delivery Anes PTL Lv  5 Term     F Vag-Spont   LIV  4 Term     F Vag-Spont   LIV  3 Term     M Vag-Spont   LIV  2 SAB           1 Term     M Vag-Spont   LIV     ROS: A ROS was performed and pertinent positives and negatives are included in the history.  GENERAL: No fevers or chills. HEENT: No change in vision, no earache, sore throat or sinus congestion. NECK: No pain or stiffness. CARDIOVASCULAR: No chest pain or pressure. No palpitations. PULMONARY: No shortness of breath, cough or wheeze. GASTROINTESTINAL: No abdominal pain, nausea, vomiting or diarrhea, melena or bright red blood per rectum. GENITOURINARY: No urinary frequency, urgency, hesitancy or dysuria. MUSCULOSKELETAL: No joint or muscle pain, no back pain, no recent trauma. DERMATOLOGIC: No rash, no itching, no lesions. ENDOCRINE: No polyuria, polydipsia, no heat or cold intolerance. No recent change in weight. HEMATOLOGICAL: No anemia or easy bruising or bleeding. NEUROLOGIC: No headache, seizures, numbness, tingling or weakness. PSYCHIATRIC: No depression, no loss of interest in normal activity or change in sleep pattern.     Exam:   BP 130/70   Ht 5\' 4"  (1.626 m)   Wt 168  lb (76.2 kg)   LMP 12/17/2016 (Approximate)   BMI 28.84 kg/m   Body mass index is 28.84 kg/m.  General appearance : Well developed well nourished female. No acute distress HEENT: Eyes: no retinal hemorrhage or exudates,  Neck supple, trachea midline, no carotid bruits, no thyroidmegaly Lungs: Clear to auscultation, no rhonchi or wheezes, or rib retractions  Heart: Regular rate and rhythm, no murmurs or gallops Breast:Examined in sitting and supine position were symmetrical in appearance, no palpable masses or tenderness,  no skin retraction, no nipple inversion, no nipple discharge, no skin discoloration, no axillary or supraclavicular lymphadenopathy Abdomen: no palpable masses or tenderness, no rebound or guarding Extremities: no edema or skin discoloration or tenderness  Pelvic: Vulva: Normal             Vagina: No gross lesions or discharge.  Rectocele grade 3/3 with valsalva.  Cervix/Uterus absent  Adnexa  Without masses or tenderness  Anus: Normal   Assessment/Plan:  49 y.o. female for annual exam   1. Encounter for wellness examination in adult Gynecologic exam status post total hysterectomy.  Rectocele grade 3/3.  No indication to repeat a Pap test this year.  Breast exam normal.  Screening mammogram August 2021 was normal.  Colonoscopy 2017.  Body mass index 28.84.  Recommend a lower calorie/carb diet.  Aerobic activities 5 times a week and light weightlifting every 2 days.  2. S/P vaginal hysterectomy  3. Post-menopausal atrophic vaginitis Compound Estradiol cream 1/4 applicator twice a week.  No contraindication.  Usage reviewed.  Prescription will be sent to the pharmacy.  4. Baden-Walker grade 3 rectocele As symptomatic rectocele grade 3/3.  Counseling done.  Prefers observation.  5. Family history of colon cancer in mother Last colonoscopy 2017.  Will organize repeat colonoscopy with gastro.  Genia Del MD, 3:35 PM 01/02/2020

## 2020-01-02 NOTE — Telephone Encounter (Signed)
Patient LVM on nurse line regarding receiving lab results from visit on 7/28. Unable to find result note or lab letter. I am happy to give patient detailed message regarding lab results.   Please advise  To PCP  Veronda Prude, RN

## 2020-01-04 ENCOUNTER — Other Ambulatory Visit: Payer: Self-pay

## 2020-01-04 MED ORDER — NONFORMULARY OR COMPOUNDED ITEM: 4 refills | Status: DC

## 2020-01-07 ENCOUNTER — Encounter: Payer: Self-pay | Admitting: Obstetrics & Gynecology

## 2020-01-10 ENCOUNTER — Telehealth: Payer: Self-pay | Admitting: Family Medicine

## 2020-01-10 NOTE — Telephone Encounter (Signed)
Hello Dr. Dareen Piano,  This patient is receiving an SSRI refill from [facility] MAP program. Please have her come in soon to reassess her regimen.  I also received a refill request for Albuterol and Breo. She does now have insurance, and I don't think we should be completing this form. I think you might be able to complete it as well.  Please escribe her Breo and Albuterol to her Pharmacy and Connect her with our [facility] pharmacy if she still has coverage issues.   Please reevaluate the need for MAP, given she has insurance coverage.   I have placed the [facility] Medication request in your physical box.

## 2020-01-13 ENCOUNTER — Other Ambulatory Visit: Payer: Self-pay | Admitting: Family Medicine

## 2020-03-22 ENCOUNTER — Telehealth: Payer: Self-pay

## 2020-03-22 ENCOUNTER — Other Ambulatory Visit: Payer: Self-pay | Admitting: Family Medicine

## 2020-03-22 DIAGNOSIS — J4520 Mild intermittent asthma, uncomplicated: Secondary | ICD-10-CM

## 2020-03-22 MED ORDER — ALBUTEROL SULFATE HFA 108 (90 BASE) MCG/ACT IN AERS: 2.0000 | INHALATION_SPRAY | Freq: Four times a day (QID) | RESPIRATORY_TRACT | 2 refills | Status: DC | PRN

## 2020-03-22 NOTE — Telephone Encounter (Signed)
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 Please inform patient that ProAir inhaler sent to pharmacy per request. If there are any issues obtaining inhaler please let me know. Thank you!!

## 2020-03-22 NOTE — Telephone Encounter (Signed)
Patient calls nurse line due to issues with Proventil inhaler. Per pharmacy, insurance is no longer covering proventil and will need new rx sent over for ProAir inhaler.   Please send new rx to [facility].   To PCP  Veronda Prude, RN

## 2020-03-23 NOTE — Telephone Encounter (Signed)
Called patient. No answer. Left VM of below information.   Veronda Prude, RN

## 2020-03-30 ENCOUNTER — Other Ambulatory Visit: Payer: Self-pay

## 2020-03-30 DIAGNOSIS — F339 Major depressive disorder, recurrent, unspecified: Secondary | ICD-10-CM

## 2020-03-31 MED ORDER — FLUOXETINE HCL 20 MG PO CAPS: 20.0000 mg | ORAL_CAPSULE | Freq: Every day | ORAL | 2 refills | Status: DC

## 2020-04-05 ENCOUNTER — Other Ambulatory Visit: Payer: Self-pay | Admitting: Family Medicine

## 2020-04-27 ENCOUNTER — Other Ambulatory Visit: Payer: Self-pay | Admitting: *Deleted

## 2020-04-27 DIAGNOSIS — J4520 Mild intermittent asthma, uncomplicated: Secondary | ICD-10-CM

## 2020-04-27 NOTE — Telephone Encounter (Signed)
Pt is no longer receiving assistance from the Health Department.  She needs these called into [facility] on [address].  Jone Baseman, CMA

## 2020-04-29 MED ORDER — ALBUTEROL SULFATE HFA 108 (90 BASE) MCG/ACT IN AERS: 2.0000 | INHALATION_SPRAY | Freq: Four times a day (QID) | RESPIRATORY_TRACT | 2 refills | Status: DC | PRN

## 2020-04-29 MED ORDER — BREO ELLIPTA 200-25 MCG/INH IN AEPB
1.0000 | INHALATION_SPRAY | Freq: Every day | RESPIRATORY_TRACT | 0 refills | Status: DC
Start: 2020-04-29 — End: 2020-06-20

## 2020-06-20 ENCOUNTER — Other Ambulatory Visit: Payer: Self-pay

## 2020-06-20 DIAGNOSIS — J4520 Mild intermittent asthma, uncomplicated: Secondary | ICD-10-CM

## 2020-06-20 MED ORDER — BREO ELLIPTA 200-25 MCG/INH IN AEPB
1.0000 | INHALATION_SPRAY | Freq: Every day | RESPIRATORY_TRACT | 0 refills | Status: DC
Start: 2020-06-20 — End: 2021-01-08

## 2020-06-20 MED ORDER — AMITRIPTYLINE HCL 25 MG PO TABS
50.0000 mg | ORAL_TABLET | Freq: Every day | ORAL | 3 refills | Status: DC
Start: 2020-06-20 — End: 2021-08-22

## 2020-06-20 MED ORDER — ALBUTEROL SULFATE HFA 108 (90 BASE) MCG/ACT IN AERS: 2.0000 | INHALATION_SPRAY | Freq: Four times a day (QID) | RESPIRATORY_TRACT | 2 refills | Status: DC | PRN

## 2020-09-01 ENCOUNTER — Other Ambulatory Visit: Payer: Self-pay | Admitting: Family Medicine

## 2020-09-17 DIAGNOSIS — Z23 Encounter for immunization: Secondary | ICD-10-CM | POA: Diagnosis not present

## 2020-11-11 ENCOUNTER — Other Ambulatory Visit: Payer: Self-pay | Admitting: Family Medicine

## 2021-01-08 ENCOUNTER — Other Ambulatory Visit: Payer: Self-pay

## 2021-01-08 MED ORDER — FLUTICASONE FUROATE-VILANTEROL 200-25 MCG/INH IN AEPB
1.0000 | INHALATION_SPRAY | Freq: Every day | RESPIRATORY_TRACT | 0 refills | Status: DC
Start: 2021-01-08 — End: 2021-04-01

## 2021-01-09 NOTE — Telephone Encounter (Signed)
I called patient to set up appointment. Patient stated she was at work and would have to call back to schedule.

## 2021-02-14 ENCOUNTER — Other Ambulatory Visit: Payer: Self-pay | Admitting: Family Medicine

## 2021-02-15 NOTE — Telephone Encounter (Signed)
Please schedule an appointment for this patient for migraine/meds follow up

## 2021-02-26 ENCOUNTER — Other Ambulatory Visit (HOSPITAL_COMMUNITY): Payer: Self-pay | Admitting: Student

## 2021-02-26 ENCOUNTER — Other Ambulatory Visit (HOSPITAL_COMMUNITY): Payer: Self-pay | Admitting: Obstetrics and Gynecology

## 2021-02-26 DIAGNOSIS — Z1231 Encounter for screening mammogram for malignant neoplasm of breast: Secondary | ICD-10-CM

## 2021-03-23 ENCOUNTER — Telehealth: Payer: Self-pay | Admitting: Family Medicine

## 2021-03-23 DIAGNOSIS — J4520 Mild intermittent asthma, uncomplicated: Secondary | ICD-10-CM

## 2021-03-23 MED ORDER — NEBULIZER AIR TUBE/PLUGS MISC: 1 refills | Status: AC

## 2021-03-23 MED ORDER — ALBUTEROL SULFATE (2.5 MG/3ML) 0.083% IN NEBU: 2.5000 mg | INHALATION_SOLUTION | Freq: Four times a day (QID) | RESPIRATORY_TRACT | 0 refills | Status: DC | PRN

## 2021-03-23 NOTE — Telephone Encounter (Signed)
**  After Hours/ Emergency Line Call**  Received after hours call from [employer]. Patient states her daughter tested positive for COVID 2 days ago. Patient herself developed cough, hoarse voice, and chills yesterday evening. She plans to take a home COVID test tomorrow, but suspects she likely has COVID as well.  She has a history of asthma and does not have any albuterol nebulizer solution at home. Also needs the tubing for her nebulizer. Patient would like to have this on hand in case she develops wheezing or worsening symptoms related to her possible COVID infection. Feels it could save her a trip to the urgent care or ED if she had her nebulizer at home.  She is speaking in full sentences, no respiratory distress. Hoarse voice and occasional cough noted. Reports her SpO2 was 98% on her home monitor and she has no wheezing, shortness of breath, or chest pain currently.   Discussed reasons to present to the ED including shortness of breath, chest pain, inability to tolerate PO, or SpO2 <92% and patient verbalized understanding. Also discussed quarantine requirements. In the meantime, will continue with supportive care.  Rx sent for Albuterol nebulizer solution and nebulizer tubing per patient request.    Maury Dus, MD PGY-2 [facility]

## 2021-03-28 ENCOUNTER — Telehealth: Payer: Self-pay

## 2021-03-28 ENCOUNTER — Other Ambulatory Visit: Payer: Self-pay

## 2021-03-28 DIAGNOSIS — J4520 Mild intermittent asthma, uncomplicated: Secondary | ICD-10-CM

## 2021-04-01 ENCOUNTER — Ambulatory Visit (HOSPITAL_COMMUNITY)
Admission: RE | Admit: 2021-04-01 | Discharge: 2021-04-01 | Disposition: A | Discharge status: Home / Self Care | Payer: BLUE CROSS/BLUE SHIELD | Source: Ambulatory Visit | Attending: Obstetrics and Gynecology | Admitting: Obstetrics and Gynecology

## 2021-04-01 ENCOUNTER — Other Ambulatory Visit: Payer: Self-pay

## 2021-04-01 DIAGNOSIS — Z1231 Encounter for screening mammogram for malignant neoplasm of breast: Secondary | ICD-10-CM | POA: Diagnosis not present

## 2021-04-01 MED ORDER — FLUTICASONE FUROATE-VILANTEROL 200-25 MCG/ACT IN AEPB: 1.0000 | INHALATION_SPRAY | Freq: Every day | RESPIRATORY_TRACT | 6 refills | Status: DC

## 2021-04-01 NOTE — Telephone Encounter (Signed)
New Rx for Virgel Bouquet has been sent to [facility].

## 2021-04-01 NOTE — Telephone Encounter (Signed)
Routed message to PCP. Littleton Haub, CMA  

## 2021-04-02 ENCOUNTER — Other Ambulatory Visit: Payer: Self-pay | Admitting: Family Medicine

## 2021-04-03 ENCOUNTER — Other Ambulatory Visit: Payer: Self-pay | Admitting: Student

## 2021-04-04 ENCOUNTER — Telehealth: Payer: Self-pay

## 2021-04-04 NOTE — Telephone Encounter (Signed)
Received fax from pharmacy, PA needed on Sumitriptan 50 mg.  Clinical questions submitted via Cover My Meds.  Waiting on response, could take up to 72 hours.  Cover My Meds info: Key: [id number]  Veronda Prude, RN

## 2021-04-04 NOTE — Telephone Encounter (Signed)
Medication approved from 04/04/2021-04/03/2022.   Veronda Prude, RN

## 2021-04-17 ENCOUNTER — Ambulatory Visit: Payer: BLUE CROSS/BLUE SHIELD | Admitting: Student

## 2021-04-24 ENCOUNTER — Other Ambulatory Visit: Payer: Self-pay

## 2021-04-24 DIAGNOSIS — F339 Major depressive disorder, recurrent, unspecified: Secondary | ICD-10-CM

## 2021-05-05 ENCOUNTER — Other Ambulatory Visit: Payer: Self-pay | Admitting: Student

## 2021-05-07 NOTE — Telephone Encounter (Signed)
Refill provided but need patient to schedule appt. Letter sent to patient, phone number in our system is out of service.

## 2021-05-22 NOTE — Telephone Encounter (Signed)
Attempted to reach patient on the [phone] which is not a working number, so I called on spouse number and left a message for patient to call the office to make appt with Dr. Jena Gauss. Aquilla Solian, CMA

## 2021-06-15 ENCOUNTER — Other Ambulatory Visit: Payer: Self-pay | Admitting: Student

## 2021-06-18 ENCOUNTER — Other Ambulatory Visit: Payer: Self-pay | Admitting: Student

## 2021-06-25 ENCOUNTER — Other Ambulatory Visit: Payer: Self-pay

## 2021-06-25 DIAGNOSIS — J4520 Mild intermittent asthma, uncomplicated: Secondary | ICD-10-CM

## 2021-06-25 MED ORDER — ALBUTEROL SULFATE HFA 108 (90 BASE) MCG/ACT IN AERS: 2.0000 | INHALATION_SPRAY | Freq: Four times a day (QID) | RESPIRATORY_TRACT | 2 refills | Status: DC | PRN

## 2021-08-01 ENCOUNTER — Encounter: Payer: BLUE CROSS/BLUE SHIELD | Admitting: Student

## 2021-08-08 ENCOUNTER — Encounter: Payer: Self-pay | Admitting: Student

## 2021-08-08 ENCOUNTER — Other Ambulatory Visit: Payer: Self-pay

## 2021-08-08 ENCOUNTER — Ambulatory Visit (INDEPENDENT_AMBULATORY_CARE_PROVIDER_SITE_OTHER): Payer: BC Managed Care – PPO | Admitting: Student

## 2021-08-08 VITALS — BP 130/80 | HR 87 | Ht 64.0 in | Wt 185.2 lb

## 2021-08-08 DIAGNOSIS — K5909 Other constipation: Secondary | ICD-10-CM

## 2021-08-08 DIAGNOSIS — I10 Essential (primary) hypertension: Secondary | ICD-10-CM | POA: Diagnosis not present

## 2021-08-08 DIAGNOSIS — E1169 Type 2 diabetes mellitus with other specified complication: Secondary | ICD-10-CM

## 2021-08-08 DIAGNOSIS — Z0000 Encounter for general adult medical examination without abnormal findings: Secondary | ICD-10-CM | POA: Diagnosis not present

## 2021-08-08 DIAGNOSIS — Z90710 Acquired absence of both cervix and uterus: Secondary | ICD-10-CM

## 2021-08-08 DIAGNOSIS — R635 Abnormal weight gain: Secondary | ICD-10-CM

## 2021-08-08 DIAGNOSIS — E785 Hyperlipidemia, unspecified: Secondary | ICD-10-CM | POA: Diagnosis not present

## 2021-08-08 LAB — POCT GLYCOSYLATED HEMOGLOBIN (HGB A1C): Hemoglobin A1C: 5.5 % (ref 4.0–5.6)

## 2021-08-08 MED ORDER — NONFORMULARY OR COMPOUNDED ITEM: 0 refills | Status: DC

## 2021-08-08 MED ORDER — ESTROGENS CONJUGATED 0.625 MG/GM VA CREA: 1.0000 | TOPICAL_CREAM | VAGINAL | 1 refills | Status: DC

## 2021-08-08 NOTE — Progress Notes (Signed)
? ? ?SUBJECTIVE:  ? ?Chief compliant/HPI: annual examination ? ?Marie Webb is a 51 y.o. who presents today for an annual exam.  ? ?Weight Gain: Patient cites wanting to lose weight. She is fasting currently for blood work.  Depression is much improved on the Prozac, has to sit and rest when getting home secondary to fatigue. 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 Patient endorses occasional constipation -- takes Miralax.  ? ?Patient has sought options of diet pills in past but is scared to take them due to lack of knowledge on safety.  ? ?Lowest adult weight: 114 lb ?Highest adult weight: 185 lb ?Amount of time at present weight: 185 lb. ?  ?History of Weight Loss Efforts ?Greatest amount of weight lost: Not much, stayed steady -- until the past few years with gradual increase in weight ?Circumstances associated with regain of weight: Taking new medications  ?Successful weight loss techniques attempted: Looked at OTC suppressants but she does not want to take what could be bad for her.  ?Unsuccessful weight loss techniques attempted: She does not eat much secondary to work, only eats a couple of big meals a day. Does exercise on the weekend  ? 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 ?Current Eating Habits ?Number of regular meals per day: 2 ?Number of snacking episodes per day: Sometimes cereal or potato chips  ?Who shops for food? patient ?Who prepares food? patient ?Who eats with patient? patient and partner ?Binge behavior?: no ?Purge behavior? no ?Anorexic behavior? no ?Eating precipitated by stress? no ?Guilt feelings associated with eating? yes - craves chocolate and eats it-- feels some guilt  ? ?Other Potential Contributing Factors ?Use of alcohol: average no drinks/week -- big occasions only  ?Use of medications that may cause weight gain  Amitriptyline and Prozac  ?Psych History:  depression ?Comorbidities: dyslipidemias and hypertension ? ?History tabs reviewed and updated.  ? ?Review of systems form reviewed and notable for fatigue and weight gain as well as constipation.  ? ?PHQ9 SCORE ONLY 08/08/2021 12/21/2019 03/17/2019  ?PHQ-9 Total Score 3 0 0  ? ? ?OBJECTIVE:  ? ?BP 130/80   Pulse 87   Ht _0  (1.626 m)   Wt 185 lb 3.2 oz (84 kg)   LMP 12/17/2016 (Approximate)   SpO2 96%   BMI 31.79 kg/m?   ?Physical Exam ?Vitals reviewed.  ?Constitutional:   ?   General: She is not in acute distress. ?   Appearance: Normal appearance. She is normal weight. She is not ill-appearing or toxic-appearing.  ?HENT:  ?   Head: Normocephalic and atraumatic.  ?   Nose: Nose normal.  ?   Mouth/Throat:  ?   Mouth: Mucous membranes are moist.  ?Eyes:  ?   Conjunctiva/sclera: Conjunctivae normal.  ?Cardiovascular:  ?   Rate and Rhythm: Normal rate and regular rhythm.  ?   Pulses: Normal pulses.  ?   Heart sounds: Normal heart sounds.  ?Pulmonary:  ?   Effort: Pulmonary effort is normal.  ?   Breath sounds: Normal breath sounds.  ?Abdominal:  ?   General: Abdomen is flat. Bowel sounds are normal. There is no distension.  ?   Palpations: Abdomen is soft.  ?   Tenderness: There is no abdominal tenderness.  ?Musculoskeletal:     ?   General: Normal range of motion.  ?  Cervical back: Normal range of motion.  ?Skin: ?   General: Skin is warm.  ?   Findings: No rash.  ?Neurological:  ?   Mental Status: She is alert. Mental status is at baseline.  ?Psychiatric:     ?   Mood and Affect: Mood normal.     ?   Behavior: Behavior normal.     ?   Thought Content: Thought content normal.  ? ? ? ?ASSESSMENT/PLAN:  ? ?No problem-specific Assessment & Plan notes found for this encounter. ?  ? ?Annual Examination  ?See AVS for age appropriate recommendations  ?PHQ score 3, reviewed and discussed.  ?BP reviewed and at goal. Updated BMP ordered  ?Asked about intimate partner violence and resources given as appropriate   ?Advance directives discussion -- no current living will, would be full code but does not want to be "kept alive by machines" ? ?Considered the following items based upon USPSTF recommendations: ?Diabetes screening: A1C ordered today -- 5.5% ?Screening for elevated cholesterol: Last lipid panel: total cholesterol 269, triglycerides 152, LDL 177, reordered lipid panel today ?HIV testing:  performed 06/27/2015-negative ?Hepatitis C: negative 12/21/19 ?Hepatitis B: low risk  ?Syphilis if at high risk: low risk ?GC/CT -- low risk-does not want  ?Osteoporosis screening considered based upon risk of fracture from [facility] calculator. Major osteoporotic fracture risk is 3.7%. DEXA not ordered.  ?Reviewed risk factors for latent tuberculosis and low risk-no need for testing  ? ? ?Discussed family history, BRCA testing  has not been done . No history of breast cancer.  ?Cervical cancer screening: Pt has history of vaginal hysterectomy  ?Breast cancer screening:  UpToDate ?Colorectal cancer screening: up to date on screening for CRC. ?Lung cancer screening: discussed. See documentation below regarding indications/risks/benefits.  ?Vaccinations: declined booster and flu  ? ?Weight Gain: Likely decreased metabolism with age, possibly menopause and medications are related to recent weight gain. Will assess reversible causes of weight gain including TSH and B12. Also referred to nutrition for assistance with changes in eating habits. Will discuss medication options for weight loss such as Wegovy or Mounjaro at next follow up. In the meantime, document eating habits and exercise over next month, follow up with me in 1 month.  ? ? ?Follow up in 1 month to discuss weight gain further ? ? ?Erskine Emery, MD ?[place]  ? ?

## 2021-08-08 NOTE — Patient Instructions (Addendum)
It was a pleasure to see you today! Thank you for choosing [facility] for your primary care. Marie Webb was seen for physical exam.  ? ?Our plans for today were: ?Continue with monitoring eating habits  ?Continue with exercising as you have been ?Would recommend going to see your gynecologist  ?We will follow up with your blood work  ? ?Nutrition  ?USDA MyPlate (PCFeed.ca)  ?5-2-1-0 Let?sGo (http://www.letsgo.org)  ?Nutrition (https://www.healthychildren.org/english/healthyliving/nutrition/pages/default.aspx) Kids Eat Right (FreeHandyman.es)  ? The DASH Diet Eating Plan (dashdiet.org) ? ?Best,  ?Chyane Greer  ? ?

## 2021-08-09 ENCOUNTER — Telehealth: Payer: Self-pay | Admitting: Student

## 2021-08-09 LAB — BASIC METABOLIC PANEL
BUN/Creatinine Ratio: 23 (ref 9–23)
BUN: 14 mg/dL (ref 6–24)
CO2: 26 mmol/L (ref 20–29)
Calcium: 9.1 mg/dL (ref 8.7–10.2)
Chloride: 99 mmol/L (ref 96–106)
Creatinine, Ser: 0.61 mg/dL (ref 0.57–1.00)
Glucose: 92 mg/dL (ref 70–99)
Potassium: 4.1 mmol/L (ref 3.5–5.2)
Sodium: 137 mmol/L (ref 134–144)
eGFR: 107 mL/min/{1.73_m2} (ref >59)

## 2021-08-09 LAB — VITAMIN B12: Vitamin B-12: 524 pg/mL (ref 232–1245)

## 2021-08-09 LAB — LIPID PANEL
Chol/HDL Ratio: 4.1 ratio (ref 0.0–4.4)
Cholesterol, Total: 243 mg/dL — ABNORMAL HIGH (ref 100–199)
HDL: 60 mg/dL (ref >39)
LDL Chol Calc (NIH): 155 mg/dL — ABNORMAL HIGH (ref 0–99)
Triglycerides: 155 mg/dL — ABNORMAL HIGH (ref 0–149)
VLDL Cholesterol Cal: 28 mg/dL (ref 5–40)

## 2021-08-09 LAB — TSH: TSH: 1.05 u[IU]/mL (ref 0.450–4.500)

## 2021-08-09 NOTE — Telephone Encounter (Signed)
Spoke to patient regarding her lab results, endorsed lipid lowering nutritional changes in diet. Patient already has appt with nutrition and will follow up with me to discuss medication options for weight loss. TSH and B12 nml.  ?

## 2021-08-20 ENCOUNTER — Other Ambulatory Visit: Payer: Self-pay | Admitting: Student

## 2021-08-22 ENCOUNTER — Other Ambulatory Visit: Payer: Self-pay | Admitting: Student

## 2021-08-23 MED ORDER — AMITRIPTYLINE HCL 25 MG PO TABS: 50.0000 mg | ORAL_TABLET | Freq: Every day | ORAL | 3 refills | Status: DC

## 2021-08-27 ENCOUNTER — Other Ambulatory Visit: Payer: Self-pay

## 2021-08-27 DIAGNOSIS — F339 Major depressive disorder, recurrent, unspecified: Secondary | ICD-10-CM

## 2021-09-02 ENCOUNTER — Telehealth: Payer: Self-pay

## 2021-09-02 DIAGNOSIS — F339 Major depressive disorder, recurrent, unspecified: Secondary | ICD-10-CM

## 2021-09-02 NOTE — Telephone Encounter (Signed)
Patient LVM on nurse line reporting difficulties getting prozac refilled. ? ?Per denial request patient needs an apt. ? ?Patient reports she was "just" here for annual exam.  ? ?Will forward back to PCP.  ?

## 2021-09-03 ENCOUNTER — Other Ambulatory Visit: Payer: Self-pay | Admitting: Student

## 2021-09-03 DIAGNOSIS — F339 Major depressive disorder, recurrent, unspecified: Secondary | ICD-10-CM

## 2021-09-03 MED ORDER — FLUOXETINE HCL 20 MG PO CAPS: 20.0000 mg | ORAL_CAPSULE | Freq: Every day | ORAL | 0 refills | Status: DC

## 2021-09-04 ENCOUNTER — Other Ambulatory Visit: Payer: Self-pay | Admitting: Student

## 2021-09-05 ENCOUNTER — Other Ambulatory Visit (HOSPITAL_COMMUNITY): Payer: Self-pay

## 2021-09-10 ENCOUNTER — Other Ambulatory Visit (HOSPITAL_COMMUNITY): Payer: Self-pay

## 2021-09-10 ENCOUNTER — Ambulatory Visit: Payer: BC Managed Care – PPO | Admitting: Family Medicine

## 2021-09-24 ENCOUNTER — Ambulatory Visit (INDEPENDENT_AMBULATORY_CARE_PROVIDER_SITE_OTHER): Payer: BC Managed Care – PPO | Admitting: Family Medicine

## 2021-09-24 DIAGNOSIS — I10 Essential (primary) hypertension: Secondary | ICD-10-CM | POA: Diagnosis not present

## 2021-09-24 DIAGNOSIS — E669 Obesity, unspecified: Secondary | ICD-10-CM

## 2021-09-24 DIAGNOSIS — Z6831 Body mass index (BMI) 31.0-31.9, adult: Secondary | ICD-10-CM | POA: Diagnosis not present

## 2021-09-24 DIAGNOSIS — Z6830 Body mass index (BMI) 30.0-30.9, adult: Secondary | ICD-10-CM | POA: Diagnosis not present

## 2021-09-24 NOTE — Patient Instructions (Addendum)
Fresh or frozen vegetables are the most nutritious.   ?Make a list of breakfast ideas you can take with you in the car on the way to work.  Bring your list to your follow-up appointment.   ? ?Saving money on food:  ?- [employer] own meats.   ?- Vegetables that are relatively inexpensive and keep well: Carrots, onions, garlic, cabbage, turnips, sweet potatoes, and ANY frozen vegetables.  ?- A very nutritious meal is rice, beans, and greens AS LONG AS it is balanced with half the meal coming from vegetables.   ? ?Goals:  ?1. In at least 10 meals per week, for lunch and dinner, include at least some protein, some starch, and vegetables.  ?2. For breakfast include at least a protein food and a starch food.   ?3. Get some exercise at least 10 minutes 3 times a week.   ? ?Document your progress on the above goals using the Goals Sheet provided today.  Bring your Goals Sheet to follow-up appt.   ? ?Call to Dr. Gerilyn Pilgrim to schedule a follow-up appointment: [phone].  (I see patients on Mondays, Tuesdays, and Thursdays.) ? ?

## 2021-09-24 NOTE — Progress Notes (Signed)
Medical Nutrition Therapy (MNT) ?Appt start time: 1600 end time: 1700 (1 hour) ?Primary concerns today: Weight management.  ? ?Relevant history/background: Ms. Kuenzi was referred by PCP Erskine Emery, MD for MNT related to obesity (ht 64"; wt 185; BMI 31 on 08/08/21.)  Also has h/o HTN & HLD.  Ms. Grismore is legally blind, and appreciates an AVS in large print.  ? ?Assessment:  Ms. Igo said she is discouraged she can't seem to lose weight despite efforts.  She suspects her antidepressant has contributed to her weight gain.  She works 7:30 AM to 4 PM M-F at [employer], and is on her feet all day.  Usually feels exhausted after work, so has not exercised since starting on the job a couple years ago.   ? ?Learning Readiness: Ready ? ?Usual eating pattern: 2 meals and 2 snacks per day. ?Frequent foods and beverages: water, coffee with 1-2 tbsp 2% milk & 2 tsp sugar; chx, pasta, potatoes, hamburgers, hotdogs, granola bar, (fruit ~2 X wk), veg/s (broccoli, grn beans, corn each 2 X wk).   ?Avoided foods: none.   ?Usual physical activity: On her feet all day at work, and walking short distances intermittently. ?Sleep: Estimates she gets 7 1/2 hrs per night; bedtime 9:30 PM; gets up at 5:15 AM.   ? ?24-hr recall: ?(Up at 5:15 AM) ?B (5:30 AM)-   1 c coffee with 1-2 tbsp 2% milk & 2 tsp sugar; ?Snk (6:30)-   1 toaster waffle, 1/4 c Craisins ?L (10 PM)-  4 slc ham, 1 slc Swiss on bun, 1 c noodles, marinara, water ?Snk ( PM)-  water ?D (6 PM)-  4 oz pork loin, 1 c broccoli, 2 tbsp cheese sauce, 1 c rice&broccoli, 1 cupcake, water ?Snk ( PM)-  water ?Typical day? Yes.    ? ?Nutritional Diagnosis:  ?NI-5.8.2 Excessive carbohydrate intake As related to starchy foods.  As evidenced by lunches that often include multiple sources of carbohydrate and no vegetables. ? ?Handouts given during visit include: ?After-Visit Summary (AVS) ?Goals Sheet ? ?Demonstrated degree of understanding via:  Teach Back  ?Barriers to  learning/adherence to lifestyle change: Likely muscle atrophy and fatigue make exercise especially challenging.   ? ?Monitoring/Evaluation:  Dietary intake, exercise, and body weight  TBD .  Patient will call; would be easier for her if she can schedule same-day appts for nutrition and PCP.   ?  ?

## 2021-10-05 ENCOUNTER — Other Ambulatory Visit: Payer: Self-pay | Admitting: Student

## 2021-10-08 NOTE — Progress Notes (Signed)
    SUBJECTIVE:   CHIEF COMPLAINT / HPI:   Weight gain: See note from 08/08/21 for extensive nutrition history. Patient saw nutrition on 09/24/21 and has tried to incorporate changes in her diet. She is very active but does note that she eats two meals a day, quite large meals by her standards. 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   Patient denies history of pancreatitis, MEN Type 2, medullary thyroid cancer.     10/11/2021    4:00 PM 08/08/2021    8:43 AM 12/21/2019    8:34 AM  PHQ9 SCORE ONLY  PHQ-9 Total Score 5 3 0     PERTINENT  PMH / PSH:  Patient Active Problem List   Diagnosis Date Noted   Encounter for wellness examination in adult 12/21/2019   BMI 30.0-30.9,adult 12/21/2019   Cervical radiculopathy 04/29/2017   Depression 02/17/2017   S/P vaginal hysterectomy 12/31/2016   Essential hypertension 03/18/2016   Family history of colon cancer in mother 06/27/2015   Migraine 10/31/2014   Adjustment disorder with mixed anxiety and depressed mood 06/27/2014   Asthma      OBJECTIVE:   BP 135/73   Pulse 96   Ht 5\' 4"  (1.626 m)   Wt 186 lb 9.6 oz (84.6 kg)   LMP 12/17/2016 (Approximate)   SpO2 99%   BMI 32.03 kg/m   General: Alert and oriented in no apparent distress Heart: Regular rate and rhythm with no murmurs appreciated Lungs: Nml WOB with no respiratory distress  Skin: Warm and dry   ASSESSMENT/PLAN:   BMI 30.0-30.9,adult Patient has expressed discouragement and has been trying to lose weight unsuccessfully. She has seen nutrition and has tried to implement healthy changes in her life. I believe she would have good success with Wegovy to assist with weight loss. No absolute contraindications. She was given instructions, demonstrations on how to use. Side effects discussed as well. Will start with [facility] and monitor for side effects, no need for updated labs to start this.    Depression Patient requests increase in Prozac. She says she has had a few days with little motivation. She has been on Prozac for a long time. Will increase dosage to 30 on patient request.      [facility], MD [facility]

## 2021-10-11 ENCOUNTER — Encounter: Payer: Self-pay | Admitting: Student

## 2021-10-11 ENCOUNTER — Ambulatory Visit: Payer: BC Managed Care – PPO | Admitting: Student

## 2021-10-11 VITALS — BP 135/73 | HR 96 | Ht 64.0 in | Wt 186.6 lb

## 2021-10-11 DIAGNOSIS — Z6830 Body mass index (BMI) 30.0-30.9, adult: Secondary | ICD-10-CM

## 2021-10-11 DIAGNOSIS — F339 Major depressive disorder, recurrent, unspecified: Secondary | ICD-10-CM | POA: Diagnosis not present

## 2021-10-11 DIAGNOSIS — R635 Abnormal weight gain: Secondary | ICD-10-CM

## 2021-10-11 MED ORDER — FLUOXETINE HCL 10 MG PO CAPS: 30.0000 mg | ORAL_CAPSULE | Freq: Every day | ORAL | 0 refills | Status: DC

## 2021-10-11 MED ORDER — WEGOVY 0.25 MG/0.5ML ~~LOC~~ SOAJ: 0.2500 mg | SUBCUTANEOUS | 0 refills | Status: DC

## 2021-10-11 NOTE — Patient Instructions (Signed)
It was great to see you today! Thank you for choosing [facility] for your primary care. Marie Webb was seen for weight gain  Today we addressed: Continuing with Wegovy for weight gain  I have also increased your Prozac dose to 30 mg    No orders of the defined types were placed in this encounter.  Meds ordered this encounter  Medications   DISCONTD: FLUoxetine (PROZAC) 10 MG capsule    Sig: Take 3 capsules (30 mg total) by mouth daily.    Dispense:  90 capsule    Refill:  0   Semaglutide-Weight Management (WEGOVY) 0.25 MG/0.5ML SOAJ    Sig: Inject 0.25 mg into the skin once a week.    Dispense:  2 mL    Refill:  0   FLUoxetine (PROZAC) 10 MG capsule    Sig: Take 3 capsules (30 mg total) by mouth daily.    Dispense:  90 capsule    Refill:  0    You should return to our clinic Return in about 4 weeks (around 11/08/2021).  I recommend that you always bring your medications to each appointment as this makes it easy to ensure you are on the correct medications and helps [place] not miss refills when you need them.  Please arrive 15 minutes before your appointment to ensure smooth check in process.  We appreciate your efforts in making this happen.  Take care and seek immediate care sooner if you develop any concerns.   Thank you for allowing me to participate in your care, Miya Luviano [employer]

## 2021-10-13 ENCOUNTER — Encounter: Payer: Self-pay | Admitting: Student

## 2021-10-13 NOTE — Assessment & Plan Note (Signed)
Patient requests increase in Prozac. She says she has had a few days with little motivation. She has been on Prozac for a long time. Will increase dosage to 30 on patient request.

## 2021-10-13 NOTE — Assessment & Plan Note (Signed)
Patient has expressed discouragement and has been trying to lose weight unsuccessfully. She has seen nutrition and has tried to implement healthy changes in her life. I believe she would have good success with Wegovy to assist with weight loss. No absolute contraindications. She was given instructions, demonstrations on how to use. Side effects discussed as well. Will start with [facility] and monitor for side effects, no need for updated labs to start this.

## 2021-10-14 ENCOUNTER — Other Ambulatory Visit (HOSPITAL_COMMUNITY): Payer: Self-pay

## 2021-10-14 ENCOUNTER — Telehealth: Payer: Self-pay

## 2021-10-14 NOTE — Telephone Encounter (Signed)
A Prior Authorization was initiated for this patients WEGOVY through CoverMyMeds.   Key: [id number]

## 2021-10-14 NOTE — Telephone Encounter (Signed)
Rec'd appeal fax from [facility]. Will submit.

## 2021-10-14 NOTE — Telephone Encounter (Signed)
Prior Auth for patients medication WEGOVY denied by [facility] via CoverMyMeds.   Reason: NOT A COVERED BENEFIT  CoverMyMeds Key: [id number]

## 2021-10-24 ENCOUNTER — Other Ambulatory Visit (HOSPITAL_COMMUNITY): Payer: Self-pay

## 2021-10-24 NOTE — Telephone Encounter (Signed)
Patient calls nurse like checking on status of appeal.   Patient reports she spoke with [employer] and they advised her they have not received anything.   Appeal was faxed last week.   Will forward to [place].

## 2021-10-25 ENCOUNTER — Other Ambulatory Visit (HOSPITAL_COMMUNITY): Payer: Self-pay

## 2021-10-25 NOTE — Telephone Encounter (Signed)
Resent appeal via CoverMyMeds fax.   Key: [id number]

## 2021-11-04 NOTE — Telephone Encounter (Signed)
Patient calls nurse line checking the status of insurance appeal.   No determination on covermymeds as of yet.   Will forward to [place].

## 2021-11-05 ENCOUNTER — Other Ambulatory Visit (HOSPITAL_COMMUNITY): Payer: Self-pay

## 2021-11-05 ENCOUNTER — Other Ambulatory Visit: Payer: Self-pay | Admitting: Student

## 2021-11-05 DIAGNOSIS — F339 Major depressive disorder, recurrent, unspecified: Secondary | ICD-10-CM

## 2021-11-05 NOTE — Telephone Encounter (Signed)
Spoke with pharmacy intake rep at [facility].  As of 10/18/21 appeal was considered an invalid request. This was a "no benefit" rejection, meaning weight loss meds are not covered by member plan regardless of appeal.   No reponse was sent to [place] since it was an "invalid request".

## 2021-11-06 NOTE — Telephone Encounter (Signed)
Called patient to advise.   Informed patient her insurance will not cover diabetes medications.   Attempted to schedule an apt with PCP to discuss options, however patient abruptly hung up the phone.

## 2021-11-11 ENCOUNTER — Ambulatory Visit: Payer: BC Managed Care – PPO | Admitting: Family Medicine

## 2021-11-11 ENCOUNTER — Ambulatory Visit: Payer: BC Managed Care – PPO | Admitting: Student

## 2021-11-20 ENCOUNTER — Ambulatory Visit: Payer: BC Managed Care – PPO | Admitting: Student

## 2021-12-20 ENCOUNTER — Ambulatory Visit: Payer: BC Managed Care – PPO | Admitting: Student

## 2021-12-25 ENCOUNTER — Other Ambulatory Visit: Payer: Self-pay | Admitting: Student

## 2022-01-23 ENCOUNTER — Telehealth: Payer: Self-pay

## 2022-01-23 NOTE — Telephone Encounter (Signed)
Patient calls nurse line requesting refill on Prozac.   Patient is requesting that medication be sent over for 20 mg and 10 mg capsules. She reports that this would be cheaper than having to pay for 90 10 mg capsules per month.   She would like this updated prescription to be sent to [facility] on [address].   Please advise.   Veronda Prude, RN

## 2022-01-24 ENCOUNTER — Other Ambulatory Visit: Payer: Self-pay | Admitting: Student

## 2022-01-24 DIAGNOSIS — F339 Major depressive disorder, recurrent, unspecified: Secondary | ICD-10-CM

## 2022-01-24 MED ORDER — FLUOXETINE HCL 10 MG PO CAPS: 10.0000 mg | ORAL_CAPSULE | Freq: Every day | ORAL | 0 refills | Status: DC

## 2022-01-24 MED ORDER — FLUOXETINE HCL 20 MG PO TABS: 20.0000 mg | ORAL_TABLET | Freq: Every day | ORAL | 0 refills | Status: DC

## 2022-01-28 NOTE — Telephone Encounter (Signed)
Patient returns call to nurse line regarding Prozac prescription. She reports that insurance will not cover tablets and is requesting that 20 mg Prozac be changed from tablets to capsules.   Forwarding request to PCP.   Veronda Prude, RN

## 2022-01-29 ENCOUNTER — Other Ambulatory Visit: Payer: Self-pay | Admitting: Student

## 2022-01-29 DIAGNOSIS — F339 Major depressive disorder, recurrent, unspecified: Secondary | ICD-10-CM

## 2022-01-29 MED ORDER — FLUOXETINE HCL 20 MG PO CAPS: 20.0000 mg | ORAL_CAPSULE | Freq: Every day | ORAL | 3 refills | Status: DC

## 2022-01-30 ENCOUNTER — Other Ambulatory Visit: Payer: Self-pay

## 2022-01-30 DIAGNOSIS — F339 Major depressive disorder, recurrent, unspecified: Secondary | ICD-10-CM

## 2022-02-05 ENCOUNTER — Other Ambulatory Visit: Payer: Self-pay | Admitting: Student

## 2022-02-19 ENCOUNTER — Other Ambulatory Visit: Payer: Self-pay | Admitting: Student

## 2022-03-03 ENCOUNTER — Other Ambulatory Visit: Payer: Self-pay | Admitting: Student

## 2022-03-03 DIAGNOSIS — F339 Major depressive disorder, recurrent, unspecified: Secondary | ICD-10-CM

## 2022-03-04 NOTE — Telephone Encounter (Signed)
Takes 20 + 10 for 30

## 2022-03-28 ENCOUNTER — Ambulatory Visit: Payer: BC Managed Care – PPO | Admitting: Family Medicine

## 2022-03-28 ENCOUNTER — Encounter: Payer: Self-pay | Admitting: Family Medicine

## 2022-03-28 VITALS — BP 141/70 | HR 93 | Wt 183.1 lb

## 2022-03-28 DIAGNOSIS — H9202 Otalgia, left ear: Secondary | ICD-10-CM

## 2022-03-28 DIAGNOSIS — H6122 Impacted cerumen, left ear: Secondary | ICD-10-CM | POA: Diagnosis not present

## 2022-03-28 MED ORDER — CARBAMIDE PEROXIDE 6.5 % OT SOLN: 10.0000 [drp] | Freq: Two times a day (BID) | OTIC | 0 refills | Status: AC

## 2022-03-28 NOTE — Patient Instructions (Addendum)
It was nice seeing you today!  Use Debrox twice a day for the next 4 days to clear out your ears.  Stay well, Zola Button, MD [place] [phone]  --  Make sure to check out at the front desk before you leave today.  Please arrive at least 15 minutes prior to your scheduled appointments.  If you had blood work today, I will send you a MyChart message or a letter if results are normal. Otherwise, I will give you a call.  If you had a referral placed, they will call you to set up an appointment. Please give [place] a call if you don't hear back in the next 2 weeks.  If you need additional refills before your next appointment, please call your pharmacy first.

## 2022-03-28 NOTE — Progress Notes (Signed)
    SUBJECTIVE:   CHIEF COMPLAINT / HPI:  Chief Complaint  Patient presents with   Ear Pain    Left     Left ear pain x 4d. Noticed some wax drainage from her ear when she was washing her ear the other day. Has noticed some muffled hearing. She swims but not recently, last time was 3 months ago. Denies fever.  PERTINENT  PMH / PSH:   Patient Care Team: Erskine Emery, MD as PCP - General (Family Medicine) Kennith Center, RD as Dietitian (Family Medicine)   OBJECTIVE:   BP (!) 141/70   Pulse 93   Wt 183 lb 2 oz (83.1 kg)   LMP 12/17/2016 (Approximate)   SpO2 100%   BMI 31.43 kg/m   Physical Exam Constitutional:      General: She is not in acute distress. HENT:     Ears:     Comments: Right TM with nonoccluding cerumen, TM appears normal.  Left TM completely obstructed by cerumen.  After lavage, there is still a significant amount of cerumen present. Cardiovascular:     Rate and Rhythm: Normal rate.  Pulmonary:     Effort: Pulmonary effort is normal. No respiratory distress.  Musculoskeletal:     Cervical back: Neck supple.  Neurological:     Mental Status: She is alert.         10/11/2021    4:00 PM  Depression screen PHQ 2/9  Decreased Interest 1  Down, Depressed, Hopeless 1  PHQ - 2 Score 2  Altered sleeping 0  Tired, decreased energy 1  Change in appetite 0  Feeling bad or failure about yourself  1  Trouble concentrating 1  Moving slowly or fidgety/restless 0  Suicidal thoughts 0  PHQ-9 Score 5  Difficult doing work/chores Not difficult at all     {Show previous vital signs (optional):23777}    ASSESSMENT/PLAN:   Impacted cerumen of left ear  Ear discomfort is likely caused by cerumen impaction, do not think any evidence of infection though unable to visualize TM.  - ear lavage partially successful, still significant amount of cerumen impaction present - rx Debrox  Return if symptoms worsen or fail to improve.   Zola Button, MD [place]

## 2022-04-18 ENCOUNTER — Other Ambulatory Visit: Payer: Self-pay | Admitting: Student

## 2022-05-11 DIAGNOSIS — J209 Acute bronchitis, unspecified: Secondary | ICD-10-CM | POA: Diagnosis not present

## 2022-05-16 ENCOUNTER — Other Ambulatory Visit: Payer: Self-pay | Admitting: Student

## 2022-05-16 DIAGNOSIS — F339 Major depressive disorder, recurrent, unspecified: Secondary | ICD-10-CM

## 2022-06-02 ENCOUNTER — Telehealth: Payer: Self-pay

## 2022-06-02 DIAGNOSIS — F339 Major depressive disorder, recurrent, unspecified: Secondary | ICD-10-CM

## 2022-06-02 MED ORDER — FLUOXETINE HCL 40 MG PO CAPS: 40.0000 mg | ORAL_CAPSULE | Freq: Every day | ORAL | 3 refills | Status: DC

## 2022-06-02 NOTE — Telephone Encounter (Signed)
Patient calls nurse line regarding Prozac prescription. She reports that insurance is not wanting to cover 20 mg and 10 mg capsules.   She reports that around Christmas, she started taking 2, 20 mg capsules daily. She is asking if she can receive new prescription for Prozac 40 mg daily.   She states that since increasing, her depression symptoms have greatly improved.   Will forward request to PCP.   Talbot Grumbling, RN

## 2022-06-02 NOTE — Telephone Encounter (Signed)
Called patient and advised of provider message.   Talbot Grumbling, RN

## 2022-06-26 ENCOUNTER — Encounter (HOSPITAL_COMMUNITY): Payer: Self-pay | Admitting: Student

## 2022-06-26 DIAGNOSIS — Z1231 Encounter for screening mammogram for malignant neoplasm of breast: Secondary | ICD-10-CM

## 2022-07-01 ENCOUNTER — Other Ambulatory Visit: Payer: Self-pay | Admitting: Student

## 2022-08-14 ENCOUNTER — Other Ambulatory Visit (HOSPITAL_COMMUNITY): Payer: Self-pay

## 2022-08-18 ENCOUNTER — Ambulatory Visit: Payer: BC Managed Care – PPO | Admitting: Obstetrics & Gynecology

## 2022-09-01 ENCOUNTER — Other Ambulatory Visit: Payer: Self-pay | Admitting: Student

## 2022-09-13 ENCOUNTER — Other Ambulatory Visit: Payer: Self-pay | Admitting: Student

## 2022-09-15 ENCOUNTER — Other Ambulatory Visit: Payer: Self-pay

## 2022-09-16 MED ORDER — AMITRIPTYLINE HCL 25 MG PO TABS: 50.0000 mg | ORAL_TABLET | Freq: Every day | ORAL | 0 refills | Status: DC

## 2022-10-06 ENCOUNTER — Other Ambulatory Visit (HOSPITAL_COMMUNITY)
Admission: RE | Admit: 2022-10-06 | Discharge: 2022-10-06 | Disposition: A | Discharge status: Home / Self Care | Payer: No Typology Code available for payment source | Source: Ambulatory Visit | Attending: Obstetrics & Gynecology | Admitting: Obstetrics & Gynecology

## 2022-10-06 ENCOUNTER — Encounter: Payer: Self-pay | Admitting: Obstetrics & Gynecology

## 2022-10-06 ENCOUNTER — Ambulatory Visit (INDEPENDENT_AMBULATORY_CARE_PROVIDER_SITE_OTHER): Payer: No Typology Code available for payment source | Admitting: Obstetrics & Gynecology

## 2022-10-06 VITALS — BP 124/70 | HR 107 | Resp 20 | Ht 64.37 in | Wt 189.8 lb

## 2022-10-06 DIAGNOSIS — N8110 Cystocele, unspecified: Secondary | ICD-10-CM

## 2022-10-06 DIAGNOSIS — Z1272 Encounter for screening for malignant neoplasm of vagina: Secondary | ICD-10-CM

## 2022-10-06 DIAGNOSIS — Z90710 Acquired absence of both cervix and uterus: Secondary | ICD-10-CM | POA: Diagnosis not present

## 2022-10-06 DIAGNOSIS — N816 Rectocele: Secondary | ICD-10-CM

## 2022-10-06 DIAGNOSIS — Z780 Asymptomatic menopausal state: Secondary | ICD-10-CM

## 2022-10-06 DIAGNOSIS — Z01419 Encounter for gynecological examination (general) (routine) without abnormal findings: Secondary | ICD-10-CM | POA: Insufficient documentation

## 2022-10-06 MED ORDER — ESTRADIOL 0.1 MG/GM VA CREA: 1.0000 | TOPICAL_CREAM | VAGINAL | 4 refills | Status: AC

## 2022-10-06 NOTE — Progress Notes (Signed)
Marie Webb [birth year] [id number]   History:    52 y.o. [id number] Married    RP:  Established patient presenting for annual gyn exam    HPI: S/P Vaginal Hysterectomy with Rogelio Seen 12/2016. Asymptomatic Cysto-rectocele.  Urine normal, no SUI and no retention.  BMs normal, treating constipation. No pain with IC. Pap Neg/HPV HR Neg 11/2016.  Pap reflex today.  Would like to restart on Estradiol cream vaginally.  Breasts normal. Mammo Neg 03/2021.  Overdue for mammo, will schedule.  BMI 32.21.  Colono 2017, hyperplastic polyp.  Repeat Colono now.   Past medical history,surgical history, family history and social history were all reviewed and documented in the EPIC chart.  Gynecologic History Patient's last menstrual period was 12/17/2016 (approximate).  Obstetric History OB History  Gravida Para Term Preterm AB Living  5 4 4   1 4   SAB IAB Ectopic Multiple Live Births  1       4    # Outcome Date GA Lbr Len/2nd Weight Sex Delivery Anes PTL Lv  5 Term     F Vag-Spont   LIV  4 Term     F Vag-Spont   LIV  3 Term     M Vag-Spont   LIV  2 SAB           1 Term     M Vag-Spont   LIV     ROS: A ROS was performed and pertinent positives and negatives are included in the history. GENERAL: No fevers or chills. HEENT: No change in vision, no earache, sore throat or sinus congestion. NECK: No pain or stiffness. CARDIOVASCULAR: No chest pain or pressure. No palpitations. PULMONARY: No shortness of breath, cough or wheeze. GASTROINTESTINAL: No abdominal pain, nausea, vomiting or diarrhea, melena or bright red blood per rectum. GENITOURINARY: No urinary frequency, urgency, hesitancy or dysuria. MUSCULOSKELETAL: No joint or muscle pain, no back pain, no recent trauma. DERMATOLOGIC: No rash, no itching, no lesions. ENDOCRINE: No polyuria, polydipsia, no heat or cold intolerance. No recent change in weight. HEMATOLOGICAL: No anemia or easy bruising or bleeding. NEUROLOGIC: No headache, seizures,  numbness, tingling or weakness. PSYCHIATRIC: No depression, no loss of interest in normal activity or change in sleep pattern.     Exam:   BP 124/70 (BP Location: Right Arm, Patient Position: Sitting)   Pulse (!) 107   Resp 20   Ht 5' 4.37" (1.635 m)   Wt 189 lb 12.8 oz (86.1 kg)   LMP 12/17/2016 (Approximate)   SpO2 98%   BMI 32.21 kg/m   Body mass index is 32.21 kg/m.  General appearance : Well developed well nourished female. No acute distress HEENT: Eyes: no retinal hemorrhage or exudates,  Neck supple, trachea midline, no carotid bruits, no thyroidmegaly Lungs: Clear to auscultation, no rhonchi or wheezes, or rib retractions  Heart: Regular rate and rhythm, no murmurs or gallops Breast:Examined in sitting and supine position were symmetrical in appearance, no palpable masses or tenderness,  no skin retraction, no nipple inversion, no nipple discharge, no skin discoloration, no axillary or supraclavicular lymphadenopathy Abdomen: no palpable masses or tenderness, no rebound or guarding Extremities: no edema or skin discoloration or tenderness  Pelvic: Vulva: Normal             Vagina: No gross lesions or discharge.  Cystocele grade 3/4 with Rectocele grade 2/3.  Cervix/Uterus absent  Adnexa  Without masses or tenderness  Anus: Normal   Assessment/Plan:  51 y.o. female for  annual exam   1. Encounter for Papanicolaou smear of vagina as part of routine gynecological examination S/P Vaginal Hysterectomy with Rogelio Seen 12/2016. Asymptomatic Cysto-rectocele.  Urine normal, no SUI and no retention.  BMs normal, treating constipation. No pain with IC. Pap Neg/HPV HR Neg 11/2016.  Pap reflex today.  Would like to restart on Estradiol cream vaginally.  Breasts normal. Mammo Neg 03/2021.  Overdue for mammo, will schedule.  BMI 32.21.  Colono 2017, hyperplastic polyp.  Repeat Colono now. - Cytology - PAP( [facility])  2. S/P vaginal hysterectomy  3. Cystocele with  rectocele Asymptomatic.  Precautions to avoid pelvic floor pressure reviewed.  4. Postmenopause  Postmenopause, well on no systemic HRT.  Other orders - estradiol (ESTRACE VAGINAL) 0.1 MG/GM vaginal cream; Place 1 Applicatorful vaginally 2 (two) times a week.   Genia Del MD, 4:15 PM

## 2022-10-10 LAB — CYTOLOGY - PAP
Comment: NEGATIVE
Diagnosis: UNDETERMINED — AB
High risk HPV: NEGATIVE

## 2022-10-25 ENCOUNTER — Other Ambulatory Visit: Payer: Self-pay | Admitting: Student

## 2022-10-25 DIAGNOSIS — F339 Major depressive disorder, recurrent, unspecified: Secondary | ICD-10-CM

## 2022-11-02 ENCOUNTER — Other Ambulatory Visit: Payer: Self-pay | Admitting: Student

## 2022-11-03 ENCOUNTER — Telehealth: Payer: Self-pay

## 2022-11-03 DIAGNOSIS — F339 Major depressive disorder, recurrent, unspecified: Secondary | ICD-10-CM

## 2022-11-03 MED ORDER — FLUOXETINE HCL 40 MG PO CAPS: 40.0000 mg | ORAL_CAPSULE | Freq: Every day | ORAL | 2 refills | Status: DC

## 2022-11-03 NOTE — Telephone Encounter (Signed)
Patient calls nurse line requesting refill on Prozac. She is requesting three month supply, as this is cheaper.   She has follow up with PCP on 12/01/22.  Veronda Prude, RN

## 2022-11-04 ENCOUNTER — Other Ambulatory Visit: Payer: Self-pay | Admitting: Student

## 2022-11-21 ENCOUNTER — Other Ambulatory Visit: Payer: Self-pay | Admitting: Student

## 2022-12-01 ENCOUNTER — Encounter: Payer: BC Managed Care – PPO | Admitting: Student

## 2022-12-12 ENCOUNTER — Encounter: Payer: BC Managed Care – PPO | Admitting: Student

## 2023-01-06 ENCOUNTER — Other Ambulatory Visit: Payer: Self-pay | Admitting: Student

## 2023-01-09 ENCOUNTER — Other Ambulatory Visit: Payer: Self-pay

## 2023-01-10 MED ORDER — SUMATRIPTAN SUCCINATE 50 MG PO TABS: 50.0000 mg | ORAL_TABLET | ORAL | 0 refills | Status: DC | PRN

## 2023-02-01 ENCOUNTER — Other Ambulatory Visit: Payer: Self-pay | Admitting: Student

## 2023-02-01 DIAGNOSIS — F339 Major depressive disorder, recurrent, unspecified: Secondary | ICD-10-CM

## 2023-02-26 ENCOUNTER — Other Ambulatory Visit: Payer: Self-pay | Admitting: Student

## 2023-02-26 DIAGNOSIS — F339 Major depressive disorder, recurrent, unspecified: Secondary | ICD-10-CM

## 2023-03-02 ENCOUNTER — Other Ambulatory Visit: Payer: Self-pay | Admitting: Student

## 2023-03-09 ENCOUNTER — Encounter: Payer: BC Managed Care – PPO | Admitting: Student

## 2023-03-30 ENCOUNTER — Other Ambulatory Visit: Payer: Self-pay | Admitting: Student

## 2023-03-30 DIAGNOSIS — F339 Major depressive disorder, recurrent, unspecified: Secondary | ICD-10-CM

## 2023-04-06 ENCOUNTER — Ambulatory Visit (INDEPENDENT_AMBULATORY_CARE_PROVIDER_SITE_OTHER): Payer: No Typology Code available for payment source | Admitting: Student

## 2023-04-06 ENCOUNTER — Encounter: Payer: Self-pay | Admitting: Student

## 2023-04-06 VITALS — BP 113/80 | HR 104 | Ht 64.0 in | Wt 187.6 lb

## 2023-04-06 DIAGNOSIS — Z0000 Encounter for general adult medical examination without abnormal findings: Secondary | ICD-10-CM

## 2023-04-06 DIAGNOSIS — J4520 Mild intermittent asthma, uncomplicated: Secondary | ICD-10-CM

## 2023-04-06 DIAGNOSIS — Z6832 Body mass index (BMI) 32.0-32.9, adult: Secondary | ICD-10-CM

## 2023-04-06 LAB — POCT GLYCOSYLATED HEMOGLOBIN (HGB A1C): Hemoglobin A1C: 5.5 % (ref 4.0–5.6)

## 2023-04-06 MED ORDER — AMLODIPINE BESYLATE 5 MG PO TABS: 5.0000 mg | ORAL_TABLET | Freq: Every day | ORAL | 2 refills | Status: DC

## 2023-04-06 MED ORDER — ALBUTEROL SULFATE HFA 108 (90 BASE) MCG/ACT IN AERS: 2.0000 | INHALATION_SPRAY | Freq: Four times a day (QID) | RESPIRATORY_TRACT | 2 refills | Status: DC | PRN

## 2023-04-06 MED ORDER — WEGOVY 0.25 MG/0.5ML ~~LOC~~ SOAJ: 0.2500 mg | SUBCUTANEOUS | 2 refills | Status: AC

## 2023-04-06 NOTE — Patient Instructions (Addendum)
It was great to see you today! Thank you for choosing [facility] for your primary care.  Today we addressed: -I have ordered your home medications  -I have also ordered wegovy -We will check some labs today  You need a mammogram to prevent breast cancer.  Please schedule an appointment.  You can call [phone].      If you haven't already, sign up for My Chart to have easy access to your labs results, and communication with your primary care physician. I recommend that you always bring your medications to each appointment as this makes it easy to ensure you are on the correct medications and helps [place] not miss refills when you need them. Call the clinic at [phone] if your symptoms worsen or you have any concerns. Return in about 3 months (around 07/07/2023). Please arrive 15 minutes before your appointment to ensure smooth check in process.  We appreciate your efforts in making this happen.  Thank you for allowing me to participate in your care, Alfredo Martinez, MD 04/06/2023, 4:11 PM PGY-3, [facility]

## 2023-04-06 NOTE — Progress Notes (Unsigned)
    SUBJECTIVE:   Chief compliant/HPI: annual examination  Marie Webb is a 52 y.o. who presents today for an annual exam.   History tabs reviewed and updated.   Review of systems form reviewed.   Weight Concerns  Still working on weight loss.  As noted in previous chart review, the patient has had had nutrition visits and has tried to incorporate changes into her diet She is a fairly active person and is active at work.  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 Has been tested for secondary causes of weight gain in the past that were all negative Denies a history of pancreatitis, MEN type II, medullary thyroid cancer Is requesting that we try an medication for weight loss  Breathing worsening with exertion during winter time usually and is requesting an albuterol refill just in case.   OBJECTIVE:   BP 113/80   Pulse (!) 104   Ht 5\' 4"  (1.626 m)   Wt 187 lb 9.6 oz (85.1 kg)   LMP 12/17/2016 (Approximate)   SpO2 96%   BMI 32.20 kg/m   General: Alert and oriented in no apparent distress Heart: Regular rate and rhythm with no murmurs appreciated Lungs: CTA bilaterally, no wheezing Abdomen: Bowel sounds present, no abdominal pain Skin: Warm and dry Extremities: No lower extremity edema   ASSESSMENT/PLAN:   Assessment & Plan Intermittent asthma without complication, unspecified asthma severity Refilled Albuterol  Encounter for wellness examination in adult Annual Examination  See AVS for age appropriate recommendations  PHQ score     10/11/2021    4:00 PM 08/08/2021    8:43 AM 12/21/2019    8:34 AM  PHQ9 SCORE ONLY  PHQ-9 Total Score 5 3 0  Reviewed and discussed.  BP reviewed and at goal.   Considered the following items based upon USPSTF recommendations: Diabetes screening: ordered Screening for elevated cholesterol: discussed HIV testing: - NR 6 yr ago Hepatitis C: - negative 3 years ago Hepatitis B:  - N/A Syphilis if  at high risk: Not at HR GC/CT not at high risk and not ordered. Osteoporosis screening considered based upon risk of fracture from [facility] calculator. Major osteoporotic fracture risk is 4.4%. DEXA not ordered.  Reviewed risk factors for latent tuberculosis and not indicated  Cervical cancer screening: - UTD ASCUS HPV negative- repeat 09/2023 Breast cancer screening: discussed potential benefits, risks including overdiagnosis and biopsy, elected proceed with mammogram Colorectal cancer screening: patient will discuss with GI - April 2017, familial hx of colon cancer   BMI 32.0-32.9,adult Patient has attempted dietary change and exercise to help with weight loss but has been unable to see long-term benefit.  Discussed that she would probably benefit from a GLP-1, unsure if insurance will cover but we will go ahead and order.  Also discussed metformin as a secondary option for weight loss.  Discussed other medication options but did not feel that she would benefit from this (would not benefit from phentermine as she already has a history of hypertension and significant anxiety, would not benefit from Wellbutrin/Topamax as she is already on an SSRI and is well controlled on this.)      Alfredo Martinez, MD [facility]

## 2023-04-06 NOTE — Assessment & Plan Note (Signed)
Annual Examination  See AVS for age appropriate recommendations  PHQ score     10/11/2021    4:00 PM 08/08/2021    8:43 AM 12/21/2019    8:34 AM  PHQ9 SCORE ONLY  PHQ-9 Total Score 5 3 0  Reviewed and discussed.  BP reviewed and at goal.   Considered the following items based upon USPSTF recommendations: Diabetes screening: ordered Screening for elevated cholesterol: discussed HIV testing: - NR 6 yr ago Hepatitis C: - negative 3 years ago Hepatitis B:  - N/A Syphilis if at high risk: Not at HR GC/CT not at high risk and not ordered. Osteoporosis screening considered based upon risk of fracture from [facility] calculator. Major osteoporotic fracture risk is 4.4%. DEXA not ordered.  Reviewed risk factors for latent tuberculosis and not indicated  Cervical cancer screening: - UTD ASCUS HPV negative- repeat 09/2023 Breast cancer screening: discussed potential benefits, risks including overdiagnosis and biopsy, elected proceed with mammogram Colorectal cancer screening: patient will discuss with GI - April 2017, familial hx of colon cancer

## 2023-04-07 ENCOUNTER — Encounter: Payer: Self-pay | Admitting: Student

## 2023-04-07 NOTE — Assessment & Plan Note (Signed)
Refilled Albuterol

## 2023-04-07 NOTE — Assessment & Plan Note (Signed)
Patient has attempted dietary change and exercise to help with weight loss but has been unable to see long-term benefit.  Discussed that she would probably benefit from a GLP-1, unsure if insurance will cover but we will go ahead and order.  Also discussed metformin as a secondary option for weight loss.  Discussed other medication options but did not feel that she would benefit from this (would not benefit from phentermine as she already has a history of hypertension and significant anxiety, would not benefit from Wellbutrin/Topamax as she is already on an SSRI and is well controlled on this.)

## 2023-04-17 ENCOUNTER — Telehealth: Payer: Self-pay

## 2023-04-17 NOTE — Telephone Encounter (Signed)
Pharmacy Patient Advocate Encounter   Received notification from CoverMyMeds that prior authorization for [facility] is required/requested.   Insurance verification completed.   The patient is insured through [employer] .   Per test claim: PA required; PA submitted to above mentioned insurance via CoverMyMeds Key/confirmation #/EOC [id number]. Status is pending

## 2023-04-20 NOTE — Telephone Encounter (Signed)
Pharmacy Patient Advocate Encounter  Received notification from [facility] that Prior Authorization for [facility] has been DENIED.  Full denial letter will be uploaded to the media tab. See denial reason below.  Reason: The requested medication and/or diagnosis are not a covered benefit and excluded from coverage in accordance with the terms and conditions of your plan benefit. Therefore, the request has been administratively denied.

## 2023-05-11 ENCOUNTER — Other Ambulatory Visit: Payer: Self-pay | Admitting: Student

## 2023-06-07 ENCOUNTER — Other Ambulatory Visit: Payer: Self-pay | Admitting: Student

## 2023-06-07 DIAGNOSIS — F339 Major depressive disorder, recurrent, unspecified: Secondary | ICD-10-CM

## 2023-06-08 ENCOUNTER — Telehealth: Payer: Self-pay

## 2023-06-08 NOTE — Telephone Encounter (Signed)
 Patient calls nurse line requesting medication recommendations.   She reports she is going on a cruise on 1/24 for one week. She reports she would like PCP recommendations for OTC medications for motion sickness.   She reports she has been told Dramamine works well, however she is unsure with all the other medications she takes. She wants to be safe.   Advised will forward to PCP for recommendations.

## 2023-06-11 NOTE — Telephone Encounter (Signed)
Patient advised on possible interaction with Dramamine.   Patient advised on peppermint oil behind the ear.  Patient appreciative.

## 2023-07-12 ENCOUNTER — Other Ambulatory Visit: Payer: Self-pay | Admitting: Student

## 2023-07-16 ENCOUNTER — Other Ambulatory Visit: Payer: Self-pay | Admitting: Student

## 2023-08-16 IMAGING — MG MM DIGITAL SCREENING BILAT W/ TOMO AND CAD
8 series · 8 of 24 positions shown · non-contrast
Comparison: Previous exam(s).

CLINICAL DATA: Screening.

EXAM:
DIGITAL SCREENING BILATERAL MAMMOGRAM WITH TOMOSYNTHESIS AND CAD
TECHNIQUE: Bilateral screening digital craniocaudal and mediolateral oblique
mammograms were obtained. Bilateral screening digital breast
tomosynthesis was performed. The images were evaluated with
computer-aided detection.

[L CC synth-2D]
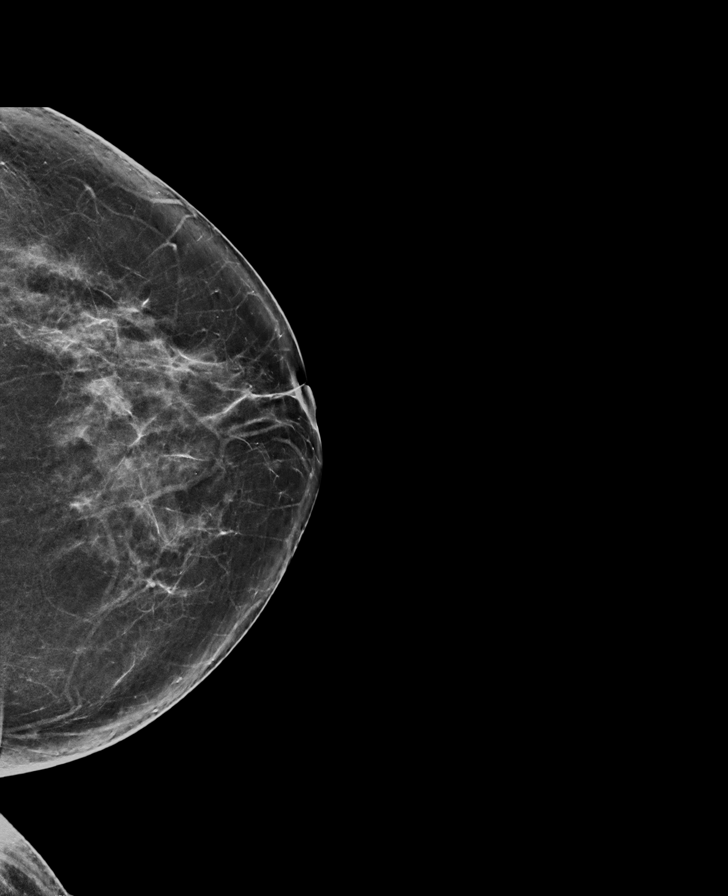

[R CC synth-2D]
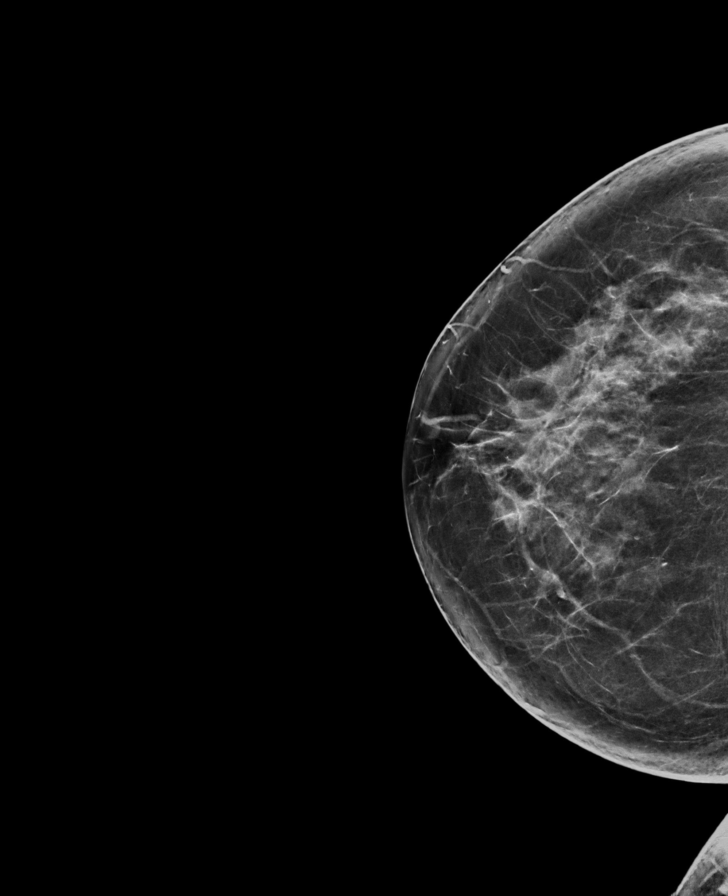

[L MLO synth-2D]
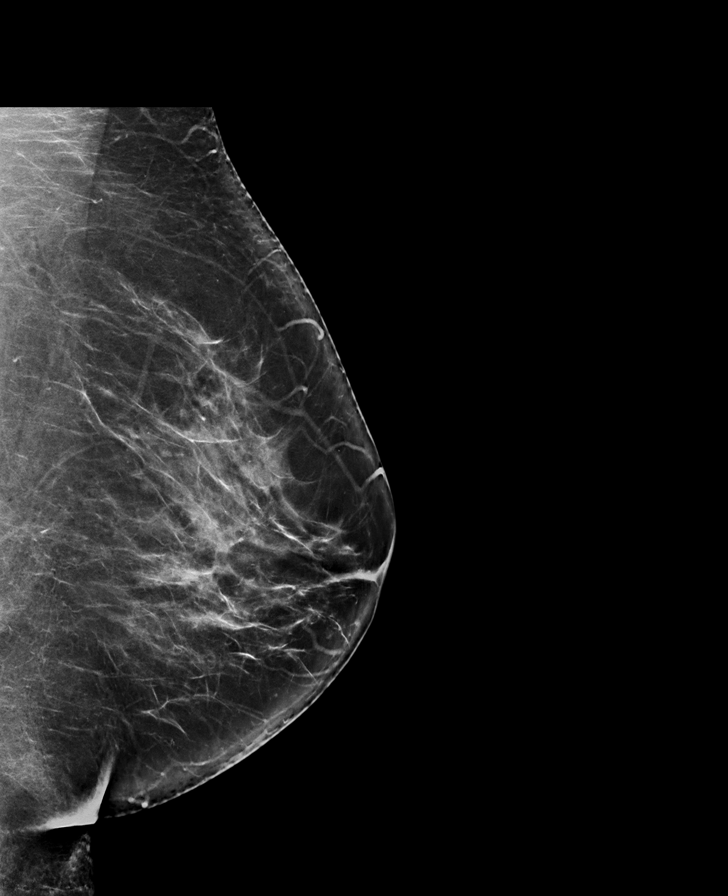

[R MLO synth-2D]
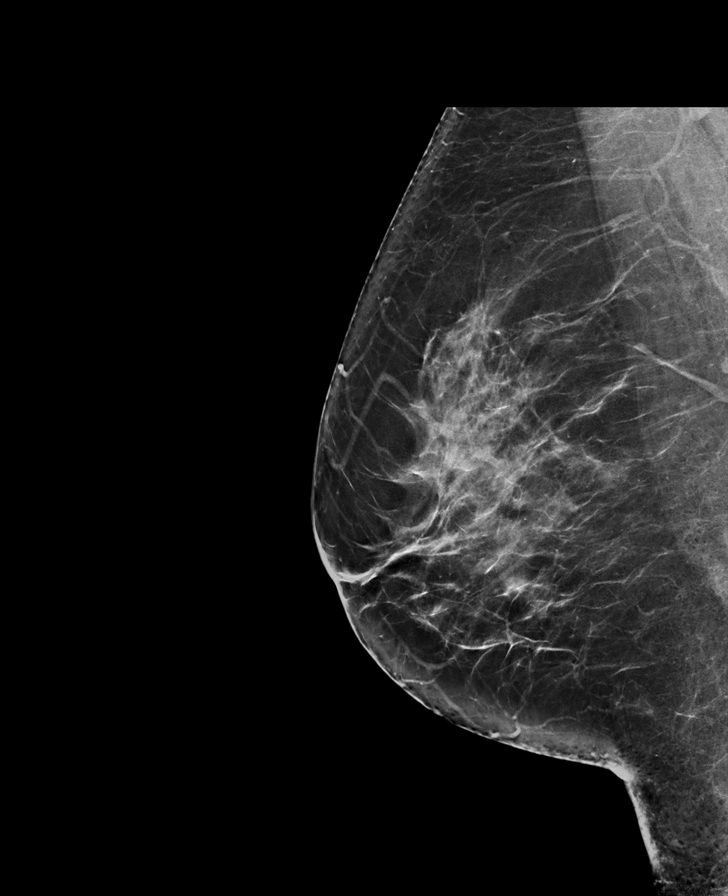

[L MLO tomo · tomo slice 43/84.0]
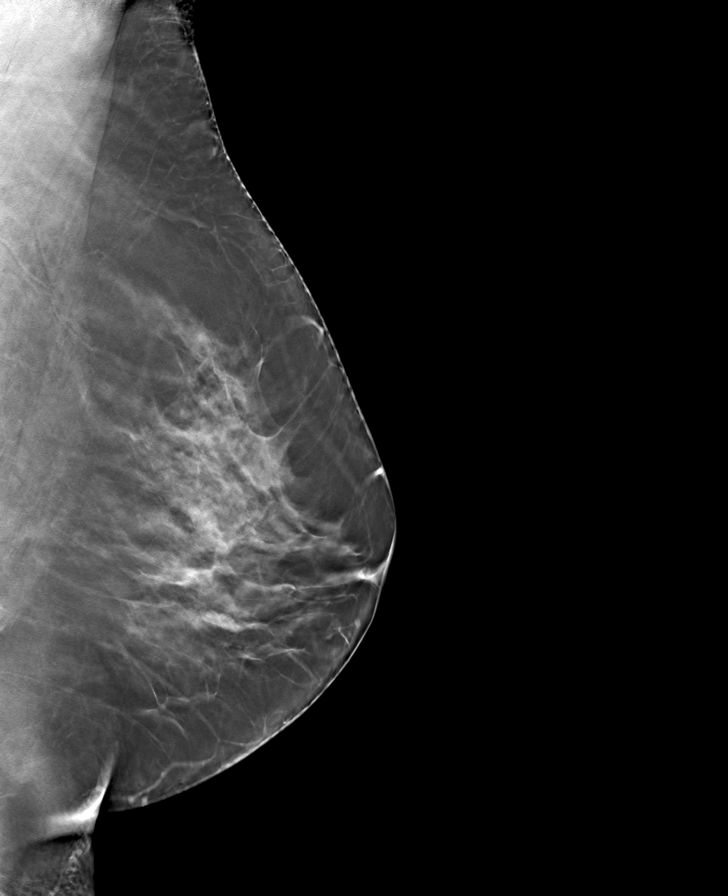

[L CC tomo · tomo slice 37/74.0]
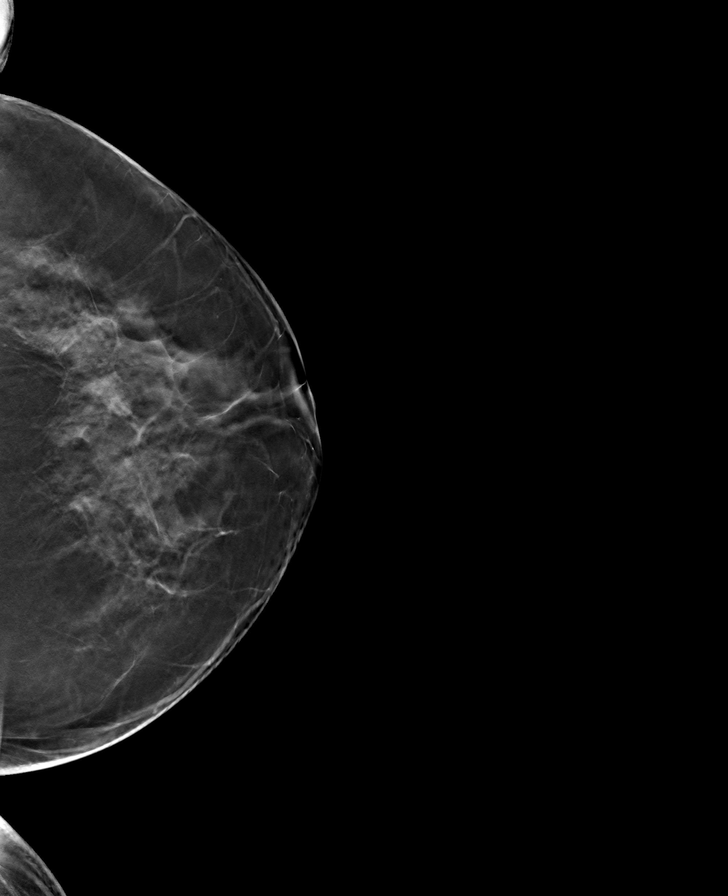

[R MLO tomo · tomo slice 43/85.0]
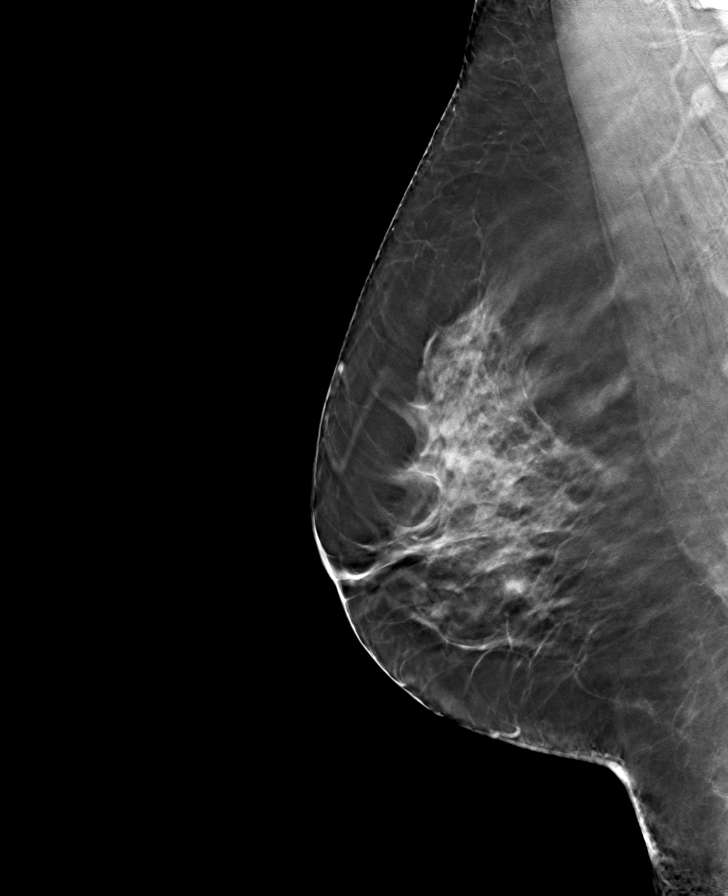

[R CC tomo · tomo slice 39/76.0]
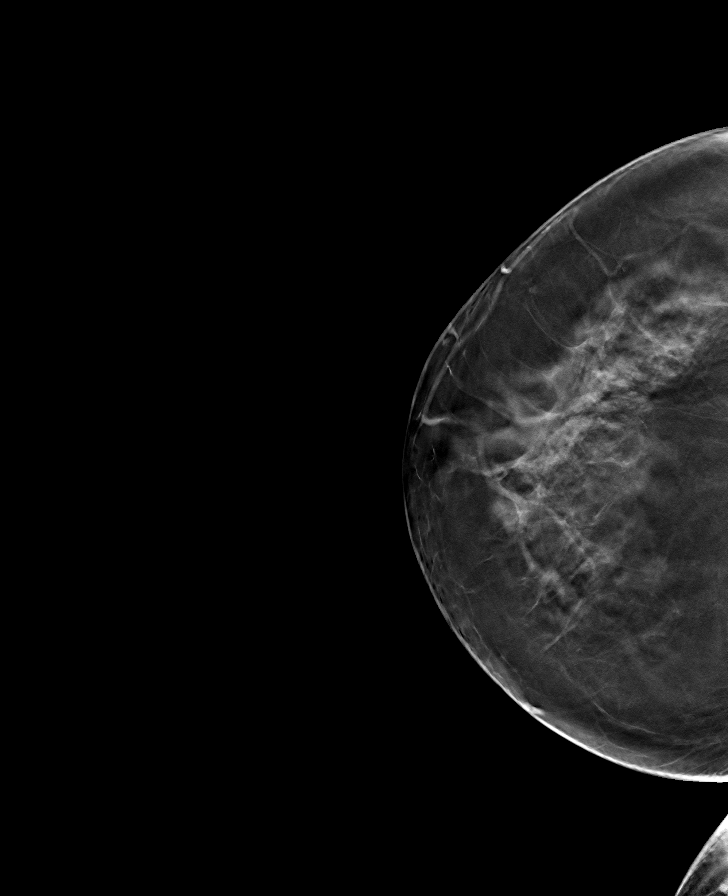

[8 of 24 positions shown; findings below may reference images not displayed]

ACR Breast Density Category c: The breast tissue is heterogeneously
dense, which may obscure small masses.
FINDINGS: There are no findings suspicious for malignancy.
IMPRESSION: No mammographic evidence of malignancy. A result letter of this
screening mammogram will be mailed directly to the patient.

RECOMMENDATION:
Screening mammogram in one year. (Code:[id number])

BI-RADS CATEGORY  1: Negative.

## 2023-08-24 ENCOUNTER — Other Ambulatory Visit: Payer: Self-pay

## 2023-08-24 MED ORDER — SUMATRIPTAN SUCCINATE 50 MG PO TABS: ORAL_TABLET | ORAL | 0 refills | Status: DC

## 2023-09-02 ENCOUNTER — Other Ambulatory Visit: Payer: Self-pay | Admitting: Student

## 2023-09-23 ENCOUNTER — Other Ambulatory Visit: Payer: Self-pay | Admitting: Student

## 2023-09-23 DIAGNOSIS — J4520 Mild intermittent asthma, uncomplicated: Secondary | ICD-10-CM

## 2023-09-27 ENCOUNTER — Other Ambulatory Visit: Payer: Self-pay | Admitting: Student

## 2023-09-27 DIAGNOSIS — F339 Major depressive disorder, recurrent, unspecified: Secondary | ICD-10-CM

## 2023-10-26 ENCOUNTER — Other Ambulatory Visit: Payer: Self-pay

## 2023-10-26 NOTE — Telephone Encounter (Signed)
 Patient calls nurse line requesting medication refill on amlodipine . She is requesting 90 day supply.   Pended refill to encounter.    Elsie Halo, RN

## 2023-10-27 MED ORDER — AMLODIPINE BESYLATE 5 MG PO TABS: 5.0000 mg | ORAL_TABLET | Freq: Every day | ORAL | 2 refills | Status: AC

## 2023-10-30 ENCOUNTER — Ambulatory Visit

## 2023-10-30 ENCOUNTER — Encounter: Payer: Self-pay | Admitting: Student

## 2023-10-30 ENCOUNTER — Ambulatory Visit: Admitting: Student

## 2023-10-30 VITALS — BP 144/84 | HR 97 | Ht 64.0 in | Wt 189.4 lb

## 2023-10-30 DIAGNOSIS — S134XXA Sprain of ligaments of cervical spine, initial encounter: Secondary | ICD-10-CM

## 2023-10-30 DIAGNOSIS — M5412 Radiculopathy, cervical region: Secondary | ICD-10-CM | POA: Diagnosis not present

## 2023-10-30 MED ORDER — DEXAMETHASONE SODIUM PHOSPHATE 10 MG/ML IJ SOLN
10.0000 mg | Freq: Once | INTRAMUSCULAR | Status: AC
  Administered 2023-10-30: 10 mg via INTRAMUSCULAR

## 2023-10-30 MED ORDER — METHOCARBAMOL 500 MG PO TABS: 1000.0000 mg | ORAL_TABLET | Freq: Three times a day (TID) | ORAL | 0 refills | Status: AC | PRN

## 2023-10-30 NOTE — Progress Notes (Signed)
    SUBJECTIVE:   CHIEF COMPLAINT / HPI:   Marie Webb is a 53 year old female with a history of cervical radiculopathy who presents with left-sided neck pain and numbness/tingling in the left arm following a fall.  She experienced a fall on Tuesday while cleaning at home, where she slipped on a bath towel and landed on her buttocks in the bathtub. Initially, she did not recall hitting her head but over the next few days has noted a mild ache in her neck, which she managed with ibuprofen.  The pain is localized to the left side of her neck and is associated with numbness/tingling in her left arm in the same general distribution of her prior cervical radiculopathy.  She has a history of cervical radiculopathy, which was last evaluated seven years ago with a negative x-ray. At that time, she experienced similar symptoms, including an inability to lift objects with her left arm, which improved with chiropractic treatment.  Currently, she is taking ibuprofen for pain relief, but it has not been effective.   No current weakness in the left arm.    OBJECTIVE:   BP (!) 144/84   Pulse 97   Ht 5\' 4"  (1.626 m)   Wt 189 lb 6.4 oz (85.9 kg)   LMP 12/17/2016 (Approximate)   SpO2 98%   BMI 32.51 kg/m   Gen: Well appearing and NAD  Neck: Supple and without midline tenderness, she has full range of motion in all planes.  There are some generalized tenderness to palpation along the muscular aspect of the left side of the neck. Resp: Normal WOB on RA, speaking in full sentences  Neuro: Strength and sensation are intact throughout, notably no weakness to the proximal nor distal muscles of the Left arm  ASSESSMENT/PLAN:   Assessment & Plan Whiplash injury to neck, initial encounter History and exam are consistent with whiplash syndrome.  Nothing on exam to suggest bony injury.  Will treat with Robaxin . -Robaxin  1000 mg every 8 hours as needed Cervical radiculopathy This injury seems to  have caused a flare of her established cervical radiculopathy.  Given that it has been many years since it has been a problem and the symptoms are emerging in the setting of an acute injury, I do not feel the need to pursue further imaging right away.  However, should symptoms fail to improve over the next 1 to 2 weeks, would repeat x-rays and consider advanced imaging. -Dexamethasone  10 mg IM x 1 in clinic today     J Lark Plum, MD [facility]

## 2023-10-30 NOTE — Patient Instructions (Signed)
 Ms. Barba,  I don't think you broke anything in your neck. Let's treat this like a whiplash injury. I also think you aggravated the cervical radiculopathy that you've had for a while. We are giving you a shot of steroid to help with that. I am giving you a muscle relaxer called Robaxin that you can take up to every 8 hours to help with the whiplash pain.  Hopefully this will improve over the next several days. If the pain does not improve over the next week or so or the numbness and tingling down your arm turns into true weakness, please come back and see us  right away.  Alexa Andrews, MD

## 2023-10-31 NOTE — Assessment & Plan Note (Signed)
 This injury seems to have caused a flare of her established cervical radiculopathy.  Given that it has been many years since it has been a problem and the symptoms are emerging in the setting of an acute injury, I do not feel the need to pursue further imaging right away.  However, should symptoms fail to improve over the next 1 to 2 weeks, would repeat x-rays and consider advanced imaging. -Dexamethasone  10 mg IM x 1 in clinic today

## 2023-11-02 ENCOUNTER — Telehealth: Payer: Self-pay

## 2023-11-02 DIAGNOSIS — M5412 Radiculopathy, cervical region: Secondary | ICD-10-CM

## 2023-11-02 NOTE — Telephone Encounter (Signed)
 Patient calls nurse line requesting advice on neck brace.   She reports she was seen on Friday for concern. She reports she received an injection and has been taking oral meds as prescribed, however reports she still can not move her neck.   She is unsure if wearing neck brace with help her hinder her healing.   Advised will forward to provider who saw patient for recommendation.

## 2023-11-03 NOTE — Telephone Encounter (Signed)
 Patient calls nurse line again in regards to injury.   She has been taking Ibuprofen 800mg  daily and Robaxin  with minimal relief. She reports the steroid injection helped on Friday. She reports she is interested in oral steroids if appropriate.   She is requesting a work note allowing her to sit vs stand until symptoms resolve.   Advised she may need a FU apt.   Will forward to provider who saw patient.

## 2023-11-03 NOTE — Telephone Encounter (Signed)
 Patient returns call to nurse line. She is asking if we should proceed with imaging due to her continued levels of pain.   Advised that I would forward message to Dr. Jearldine Mina.   Offered to schedule patient appointment. She states that she would like to hold off scheduling at this time as she was told that her co pay at our office would be $150.   Elsie Halo, RN

## 2023-11-04 MED ORDER — PREDNISONE 10 MG (21) PO TBPK
ORAL_TABLET | ORAL | 0 refills | Status: AC
Start: 2023-11-04 — End: ?

## 2023-11-04 NOTE — Addendum Note (Signed)
 Addended by: Darell Echevaria B on: 11/04/2023 12:08 PM   Modules accepted: Orders

## 2023-11-04 NOTE — Telephone Encounter (Signed)
 Called patient to discuss. Ongoing pain. She is requesting imaging which we discussed but deferred at our last visit.  She tells me that she felt much better after the steroid injection but that pain returned. She is wondering a round of oral steroids might be worth a trial. She tells me that she cannot come in for an in-person visit due to an insurance issue where they are charging her $150 copay for primary care visits. She is working on this.  - DG C spine ordered - Prednisone  taper 60-50-40-30-20-10mg , one day each   Alexa Andrews, MD

## 2023-11-09 ENCOUNTER — Other Ambulatory Visit: Payer: Self-pay

## 2023-11-10 MED ORDER — SUMATRIPTAN SUCCINATE 50 MG PO TABS: ORAL_TABLET | ORAL | 0 refills | Status: DC

## 2023-12-02 ENCOUNTER — Other Ambulatory Visit: Payer: Self-pay

## 2023-12-02 DIAGNOSIS — J4520 Mild intermittent asthma, uncomplicated: Secondary | ICD-10-CM

## 2023-12-02 MED ORDER — ALBUTEROL SULFATE HFA 108 (90 BASE) MCG/ACT IN AERS: 2.0000 | INHALATION_SPRAY | Freq: Four times a day (QID) | RESPIRATORY_TRACT | 2 refills | Status: DC | PRN

## 2023-12-31 ENCOUNTER — Other Ambulatory Visit: Payer: Self-pay

## 2023-12-31 DIAGNOSIS — F339 Major depressive disorder, recurrent, unspecified: Secondary | ICD-10-CM

## 2023-12-31 MED ORDER — FLUOXETINE HCL 40 MG PO CAPS: 40.0000 mg | ORAL_CAPSULE | Freq: Every day | ORAL | 0 refills | Status: DC

## 2024-01-06 ENCOUNTER — Other Ambulatory Visit: Payer: Self-pay

## 2024-01-06 NOTE — Telephone Encounter (Signed)
 Called patient back to clarify prescription and dosage she has been taking.   She did not answer. Will try to reach her at later time.   Chiquita JAYSON English, RN

## 2024-01-07 ENCOUNTER — Other Ambulatory Visit: Payer: Self-pay | Admitting: Family Medicine

## 2024-01-07 MED ORDER — AMITRIPTYLINE HCL 25 MG PO TABS: 25.0000 mg | ORAL_TABLET | Freq: Every day | ORAL | 2 refills | Status: AC

## 2024-01-07 NOTE — Telephone Encounter (Signed)
 Patient returns call to nurse line.   She reports she takes Amitriptyline  25mg  at bedtime. She reports for preventative headaches.   She is requesting a 90 day supply. I have updated prescription.  Will forward to PCP.

## 2024-01-08 NOTE — Telephone Encounter (Signed)
 Called patient. She reports that she spoke with PCP earlier this afternoon.   Chiquita JAYSON English, RN

## 2024-02-16 ENCOUNTER — Telehealth: Payer: Self-pay

## 2024-02-16 NOTE — Telephone Encounter (Signed)
 Patient Marie Webb on nurse line reporting an accident at work.   She reports she is requesting her last Tetanus vaccine.  I attempted to call patient back to get more information, however no answer.   Will await her return call.

## 2024-02-26 ENCOUNTER — Ambulatory Visit (HOSPITAL_COMMUNITY)
Admission: RE | Admit: 2024-02-26 | Discharge: 2024-02-26 | Disposition: A | Discharge status: Home / Self Care | Source: Ambulatory Visit | Attending: Family Medicine | Admitting: Family Medicine

## 2024-02-26 ENCOUNTER — Encounter (HOSPITAL_COMMUNITY): Payer: Self-pay

## 2024-02-26 DIAGNOSIS — Z0000 Encounter for general adult medical examination without abnormal findings: Secondary | ICD-10-CM | POA: Diagnosis present

## 2024-02-26 DIAGNOSIS — Z1231 Encounter for screening mammogram for malignant neoplasm of breast: Secondary | ICD-10-CM | POA: Diagnosis not present

## 2024-02-29 ENCOUNTER — Other Ambulatory Visit: Payer: Self-pay

## 2024-02-29 MED ORDER — SUMATRIPTAN SUCCINATE 50 MG PO TABS: ORAL_TABLET | ORAL | 0 refills | Status: DC

## 2024-03-07 ENCOUNTER — Ambulatory Visit: Admitting: Family Medicine

## 2024-03-07 VITALS — BP 136/88 | Ht 64.0 in | Wt 175.0 lb

## 2024-03-07 DIAGNOSIS — M722 Plantar fascial fibromatosis: Secondary | ICD-10-CM | POA: Diagnosis not present

## 2024-03-07 NOTE — Progress Notes (Signed)
 PCP: Stoney Blizzard, DO  Subjective:   Discussed the use of AI scribe software for clinical note transcription with the patient, who gave verbal consent to proceed.  History of Present Illness Marie Webb is a 53 year old female who presents with left heel pain.  Left heel pain - Severe pain localized to the bottom of the left heel - Pain described as excruciating by the end of the workday - Quality of pain is brutal, burning, and feels like a bruise - Pain is slightly improved on Mondays after weekend rest but persists throughout the week - Pain worsens with prolonged standing and when barefoot, especially in the mornings - Occasional radiation of pain to the ankle when bending after prolonged standing - No prior history of heel pain or previous injections for this issue  Aggravating and alleviating factors - Pain worsens with standing for prolonged periods and when barefoot - Increased pain in the mornings - Some relief with use of a chair at work - Gel inserts in shoes have not provided significant relief  Footwear and occupational factors - Current shoes are approximately one year old and may be worn out - Wears motorcycle boots to work, which may not be suitable for her condition - Uncertain about appropriate footwear or need for arch support - Works at [employer] the [employer], requiring standing for eight hours daily   Past Medical History:  Diagnosis Date   Asthma    Bilateral arm pain 06/08/2015   Carpal tunnel syndrome 02/17/2017   Complication of anesthesia    Depression    Headache    Hypertension    Left foot pain 06/03/2015   Legally blind    Night terrors 03/18/2016   Night terrors, adult    PONV (postoperative nausea and vomiting)    Right leg numbness 10/12/2017   Sinus tachycardia 06/18/2013    Current Outpatient Medications on File Prior to Visit  Medication Sig Dispense Refill   albuterol  (VENTOLIN  HFA) 108 (90 Base)  MCG/ACT inhaler Inhale 2 puffs into the lungs every 6 (six) hours as needed for wheezing or shortness of breath. 18 g 2   amitriptyline  (ELAVIL ) 25 MG tablet Take 1 tablet (25 mg total) by mouth at bedtime. 90 tablet 2   amLODipine  (NORVASC ) 5 MG tablet Take 1 tablet (5 mg total) by mouth daily. 90 tablet 2   estradiol  (ESTRACE  VAGINAL) 0.1 MG/GM vaginal cream Place 1 Applicatorful vaginally 2 (two) times a week. 42.5 g 4   FLUoxetine  (PROZAC ) 40 MG capsule Take 1 capsule (40 mg total) by mouth daily. 90 capsule 0   methocarbamol  (ROBAXIN ) 500 MG tablet Take 2 tablets (1,000 mg total) by mouth every 8 (eight) hours as needed for muscle spasms. 45 tablet 0   naproxen  (NAPROSYN ) 500 MG tablet Take 1 tablet (500 mg total) by mouth 2 (two) times daily as needed. 60 tablet 2   predniSONE  (STERAPRED UNI-PAK 21 TAB) 10 MG (21) TBPK tablet Take by mouth daily. Take 6 tabs by mouth on day 1, then 5 tabs on day 2, then 4 tabs on day 3, then 3 tabs on day 4, 2 tabs on day 5, then 1 tab on day 6 21 each 0   Respiratory Therapy Supplies (NEBULIZER AIR TUBE/PLUGS) MISC Nebulizer tubing 1 each 1   Semaglutide -Weight Management (WEGOVY ) 0.25 MG/0.5ML SOAJ Inject 0.25 mg into the skin once a week. 2 mL 2   SUMAtriptan  (IMITREX ) 50 MG tablet TAKE ONE  TABLET BY MOUTH EVERY TWO HOURS AS NEEDED FOR MIGRAINE MAXIMUM DAILY DOSE 200 MG. 9 tablet 0   No current facility-administered medications on file prior to visit.    Past Surgical History:  Procedure Laterality Date   ABDOMINAL HYSTERECTOMY     ENDOMETRIAL ABLATION     VAGINAL HYSTERECTOMY N/A 12/31/2016   Procedure: VAGINAL HYSTERECTOMY  WITH VAGINAL APEX SUSPENSION;  Surgeon: Jayne Vonn DEL, MD;  Location: [facility];  Service: Gynecology;  Laterality: N/A;    Allergies  Allergen Reactions   Stadol [Butorphanol] Other (See Comments)    Anxiety/tremors   Advair Diskus [Fluticasone -Salmeterol] Other (See Comments)    Headache   Sertraline Hcl Other (See Comments)     Worsened Headaches (failed to reduce headaches)    BP 136/88   Ht 5' 4 (1.626 m)   Wt 175 lb (79.4 kg)   LMP 12/17/2016 (Approximate)   BMI 30.04 kg/m       No data to display              No data to display              Objective:  Physical Exam:  Gen: NAD, comfortable in exam room  Left foot/ankle: Pes planus left > right.  No other gross deformity, swelling, ecchymoses Full range of motion ankle with normal strength Tenderness to palpation plantar heel.  No other tenderness. Negative ant drawer and negative talar tilt.   Negative calcaneal squeeze. NV intact distally.   Assessment & Plan Left plantar fasciitis Chronic left plantar fasciitis with pain at the left heel, worsened by standing and work. Ruled out heel stress fracture and fat pad atrophy. Flattened left arch noted.  - Recommend arch support inserts, such as hapad with scaphoid pad, for various shoes including motorcycle boots. - Provide rehabilitation exercises today with instructions and pictures. - Advise use of elastic bandages for plantar fascia support, available over the counter. - Consider use of a [place] sock at night to maintain stretch. - Write a note for work allowing her to sit as needed to alleviate pain. - Encourage continued activity and reassure condition will not worsen with activity.

## 2024-03-07 NOTE — Patient Instructions (Addendum)
 You have plantar fasciitis Take tylenol  and/or aleve  as needed for pain  Plantar fascia stretch for 20-30 seconds (do 3 of these) in morning Lowering/raise on a step exercises 3 x 10 once or twice a day - this is very important for long term recovery. Ice heel for 15 minutes as needed. Avoid flat shoes/barefoot walking as much as possible. Arch straps have been shown to help with pain. Inserts are important (dr. farris active series, spencos, our green insoles, custom orthotics). Consider strassburg sock at bedtime to help stretch this out. Physical therapy is also an option. Follow up with me in 6 weeks.

## 2024-03-30 ENCOUNTER — Other Ambulatory Visit: Payer: Self-pay | Admitting: Family Medicine

## 2024-03-30 DIAGNOSIS — F339 Major depressive disorder, recurrent, unspecified: Secondary | ICD-10-CM

## 2024-05-16 ENCOUNTER — Other Ambulatory Visit: Payer: Self-pay | Admitting: Family Medicine

## 2024-06-02 ENCOUNTER — Other Ambulatory Visit: Payer: Self-pay | Admitting: Family Medicine

## 2024-06-02 DIAGNOSIS — J4520 Mild intermittent asthma, uncomplicated: Secondary | ICD-10-CM

## 2024-06-30 ENCOUNTER — Other Ambulatory Visit: Payer: Self-pay | Admitting: Family Medicine

## 2024-06-30 DIAGNOSIS — F339 Major depressive disorder, recurrent, unspecified: Secondary | ICD-10-CM
